# Patient Record
Sex: Female | Born: 1970 | Race: Black or African American | Hispanic: No | Marital: Married | Smoking: Never smoker
Health system: Southern US, Community
[De-identification: ages and names within clinical notes are randomized; demographics above are authoritative.]

## PROBLEM LIST (undated history)

## (undated) DIAGNOSIS — J45909 Unspecified asthma, uncomplicated: Secondary | ICD-10-CM

## (undated) DIAGNOSIS — R51 Headache: Secondary | ICD-10-CM

## (undated) DIAGNOSIS — R519 Headache, unspecified: Secondary | ICD-10-CM

## (undated) DIAGNOSIS — E119 Type 2 diabetes mellitus without complications: Secondary | ICD-10-CM

## (undated) DIAGNOSIS — Z86718 Personal history of other venous thrombosis and embolism: Secondary | ICD-10-CM

## (undated) DIAGNOSIS — K219 Gastro-esophageal reflux disease without esophagitis: Secondary | ICD-10-CM

## (undated) DIAGNOSIS — Z95828 Presence of other vascular implants and grafts: Secondary | ICD-10-CM

## (undated) HISTORY — PX: TONSILLECTOMY: SUR1361

## (undated) HISTORY — PX: CHOLECYSTECTOMY: SHX55

## (undated) HISTORY — PX: DILATION AND CURETTAGE OF UTERUS: SHX78

## (undated) HISTORY — PX: OTHER SURGICAL HISTORY: SHX169

## (undated) NOTE — *Deleted (*Deleted)
[facility]  [address], [address] [place], [place] [address] Phone: [phone]  Fax: [phone]   Clinic Day:  11/01/2019  Referring physician: Becky Augusta, NP  Chief Complaint: Karen Charles is a 37 y.o. female with a history of recurrent DVTs who is referred in consultation with Becky Augusta, NP for assessment and management.   HPI: The patient has a long history of clots and bleeding associated with hormonal birth control. She had a cerebral bleed in 1998 while on Depo injections. She had 4 PEs and 2 DVTs in her right leg in 2010 while on a Mirena IUD. She also had a DVT in her left arm in January 2021. The patient underwent bilateral salpingectomy on 08/04/2017 and is no longer taking hormonal birth control.  Bilateral lower extremity venous duplex on 09/05/2010 revealed a nonocclusive deep vein thrombosis involving the  upper portion of the superficial femoral on the right. There was no deep vein thrombosis seen on the left.  Left lower extremity venous ultrasound on 05/02/2019 revealed no evidence of acute or chronic DVT.  The patient presented to Urgent care on 10/30/2019 for left shoulder pain x 4 days. This was a similar presentation to the DVT she had in January 2021. She denies calf pain or leg swelling. She was not on any anticoagulants, though she had taken Aspirin in the past***. CT angiogram was negative for acute PE or thoracic aortic dissection. Her D-dimer was 1,074,70. She was started on Xarelto 15 mg BID x 21 days, then 20 mg daily. She was referred to hematology.  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  Symptomatically, ***  Past Medical History:  Diagnosis Date  . Asthma   . Diabetes mellitus without complication (HCC)   . GERD (gastroesophageal reflux disease)   . Headache   . Hx of blood clots   . Presence of IVC filter    inserted 07/12/2014    Past Surgical History:  Procedure Laterality Date  .  CHOLECYSTECTOMY    . DILATION AND CURETTAGE OF UTERUS    . 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 TONSILLECTOMY      Family History  Problem Relation Age of Onset  . Lupus Mother   . Diabetes Father   . Congestive Heart Failure Father   . Hypertension Father   . Breast cancer Neg Hx     Social History:  reports that she has never smoked. She has never used smokeless tobacco. She reports that she does not drink alcohol and does not use drugs.The patient is ***alone/accompanied by *** today.  Allergies: No Known Allergies  Current Medications: Current Outpatient Medications  Medication Sig Dispense Refill  . albuterol (PROVENTIL HFA;VENTOLIN HFA) 108 (90 BASE) MCG/ACT inhaler Inhale 1-2 puffs into the lungs every 6 (six) hours as needed for wheezing or shortness of breath.    Marland Kitchen aspirin 81 MG chewable tablet Chew by mouth daily.    . cetirizine (ZYRTEC) 10 MG tablet Take 10 mg by mouth daily as needed for allergies.    . cyanocobalamin 1000 MCG tablet Take by mouth.    . ergocalciferol (VITAMIN D2) 1.25 MG (50000 UT) capsule Take by mouth.    . Multiple Vitamin (MULTIVITAMIN) tablet Take  1 tablet by mouth daily.    . pantoprazole (PROTONIX) 20 MG tablet Take 20 mg by mouth daily.    Marland Kitchen RIVAROXABAN (XARELTO) VTE STARTER PACK (15 & 20 MG TABLETS) Take 15 mg by mouth 2 (two) times daily. Follow package directions: Take one 15mg  tablet by mouth twice a day. On day 22, switch to one 20mg  tablet once a day. Take with food. 51 each 0   No current facility-administered medications for this visit.    Review of Systems  Constitutional: Negative for chills, diaphoresis, fever, malaise/fatigue  and weight loss.  HENT: Negative for congestion, ear discharge, ear pain, hearing loss, nosebleeds, sinus pain, sore throat and tinnitus.   Eyes: Negative for blurred vision and double vision.  Respiratory: Negative for cough, hemoptysis, sputum production and shortness of breath.   Cardiovascular: Negative for chest pain, palpitations and leg swelling.  Gastrointestinal: Negative for abdominal pain, blood in stool, constipation, diarrhea, heartburn, melena, nausea and vomiting.  Genitourinary: Negative for dysuria, frequency, hematuria and urgency.  Musculoskeletal: Negative for back pain, joint pain, myalgias and neck pain.  Skin: Negative for itching and rash.  Neurological: Negative for dizziness, tingling, sensory change, weakness and headaches.  Endo/Heme/Allergies: Does not bruise/bleed easily.  Psychiatric/Behavioral: Negative for depression and memory loss. The patient is not nervous/anxious and does not have insomnia.   All other systems reviewed and are negative.   Performance status (ECOG): ***  Vitals Last menstrual period 10/06/2019.   Physical Exam Vitals and nursing note reviewed.  Constitutional:      General: She is not in acute distress.    Appearance: She is not diaphoretic.  HENT:     Head: Normocephalic and atraumatic.     Mouth/Throat:     Mouth: Mucous membranes are moist.     Pharynx: Oropharynx is clear.  Eyes:     General: No scleral icterus.    Extraocular Movements: Extraocular movements intact.     Conjunctiva/sclera: Conjunctivae normal.     Pupils: Pupils are equal, round, and reactive to light.  Cardiovascular:     Rate and Rhythm: Normal rate and regular rhythm.     Heart sounds: Normal heart sounds. No murmur heard.   Pulmonary:     Effort: Pulmonary effort is normal. No respiratory distress.     Breath sounds: Normal breath sounds. No wheezing or rales.  Chest:     Chest wall: No tenderness.  Abdominal:     General: Bowel sounds are  normal. There is no distension.     Palpations: Abdomen is soft. There is no mass.     Tenderness: There is no abdominal tenderness. There is no guarding or rebound.  Musculoskeletal:        General: No swelling or tenderness. Normal range of motion.     Cervical back: Normal range of motion and neck supple.  Skin:    General: Skin is warm and dry.  Neurological:     Mental Status: She is alert and oriented to person, place, and time.  Psychiatric:        Behavior: Behavior normal.        Thought Content: Thought content normal.        Judgment: Judgment normal.     No visits with results within 3 Day(s) from this visit.  Latest known visit with results is:  Admission on 10/30/2019, Discharged on 10/30/2019  Component Date Value Ref Range Status  . Sodium 10/30/2019 140  135 - 145 mmol/L Final  .  Potassium 10/30/2019 4.8  3.5 - 5.1 mmol/L Final  . Chloride 10/30/2019 107  98 - 111 mmol/L Final  . CO2 10/30/2019 23  22 - 32 mmol/L Final  . Glucose, Bld 10/30/2019 85  70 - 99 mg/dL Final   Glucose reference range applies only to samples taken after fasting for at least 8 hours.  . BUN 10/30/2019 8  6 - 20 mg/dL Final  . Creatinine, Ser 10/30/2019 1.11* 0.44 - 1.00 mg/dL Final  . Calcium 44/03/4740 9.3  8.9 - 10.3 mg/dL Final  . Total Protein 10/30/2019 8.3* 6.5 - 8.1 g/dL Final  . Albumin 59/56/3875 4.3  3.5 - 5.0 g/dL Final  . AST 64/33/2951 23  15 - 41 U/L Final  . ALT 10/30/2019 20  0 - 44 U/L Final  . Alkaline Phosphatase 10/30/2019 54  38 - 126 U/L Final  . Total Bilirubin 10/30/2019 1.6* 0.3 - 1.2 mg/dL Final  . GFR, Estimated 10/30/2019 60* >60 mL/min Final  . Anion gap 10/30/2019 10  5 - 15 Final   Performed at [facility], [address], [place], [place] [address]  . WBC 10/30/2019 5.1  4.0 - 10.5 K/uL Final  . RBC 10/30/2019 5.25* 3.87 - 5.11 MIL/uL Final  . Hemoglobin 10/30/2019 15.2* 12.0 - 15.0 g/dL Final  . HCT 60/63/0160 44.4  36 - 46 % Final  .  MCV 10/30/2019 84.6  80.0 - 100.0 fL Final  . MCH 10/30/2019 29.0  26.0 - 34.0 pg Final  . MCHC 10/30/2019 34.2  30.0 - 36.0 g/dL Final  . RDW 10/93/2355 13.2  11.5 - 15.5 % Final  . Platelets 10/30/2019 276  150 - 400 K/uL Final  . nRBC 10/30/2019 0.0  0.0 - 0.2 % Final  . Neutrophils Relative % 10/30/2019 50  % Final  . Neutro Abs 10/30/2019 2.6  1.7 - 7.7 K/uL Final  . Lymphocytes Relative 10/30/2019 38  % Final  . Lymphs Abs 10/30/2019 1.9  0.7 - 4.0 K/uL Final  . Monocytes Relative 10/30/2019 8  % Final  . Monocytes Absolute 10/30/2019 0.4  0.1 - 1.0 K/uL Final  . Eosinophils Relative 10/30/2019 3  % Final  . Eosinophils Absolute 10/30/2019 0.1  0 - 0 K/uL Final  . Basophils Relative 10/30/2019 1  % Final  . Basophils Absolute 10/30/2019 0.0  0 - 0 K/uL Final  . Immature Granulocytes 10/30/2019 0  % Final  . Abs Immature Granulocytes 10/30/2019 0.01  0.00 - 0.07 K/uL Final   Performed at [facility], [address] [place], [place] [address]  . Fibrin derivatives D-dimer ([facility]) 10/30/2019 1,074.70* 0.00 - 499.00 ng/mL (FEU) Final   Comment: (NOTE) <> Exclusion of Venous Thromboembolism (VTE) - OUTPATIENT ONLY   (Emergency Department or [place])    0-499 ng/ml (FEU): With a low to intermediate pretest probability                      for VTE this test result excludes the diagnosis                      of VTE.   >499 ng/ml (FEU) : VTE not excluded; additional work up for VTE is                      required.  <> Testing on Inpatients and Evaluation of Disseminated Intravascular   Coagulation (DIC) Reference Range:  0-499 ng/ml (FEU) Performed at [facility], [address], [place], [place] [address]     Assessment:  MARRIANNE SICA is a 3 y.o. female ***  The patient received the Pfizer COVID-19 vaccine on 03/26/20201 and 05/09/2019.  Symptomatically, ***  Plan: 1.    I discussed the assessment and treatment plan with the patient.  The  patient was provided an opportunity to ask questions and all were answered.  The patient agreed with the plan and demonstrated an understanding of the instructions.  The patient was advised to call back if the symptoms worsen or if the condition fails to improve as anticipated.  I provided *** minutes of face-to-face time during this this encounter and > 50% was spent counseling as documented under my assessment and plan.    Karen C. Merlene Pulling, MD, PhD    11/01/2019, 12:48 PM  I, Danella Penton Tufford, am acting as Neurosurgeon for [employer]. Merlene Pulling, MD, PhD.  {Add scribe attestation statement}

## (undated) NOTE — *Deleted (*Deleted)
[facility]  [address], [address] [place], [place] [address] Phone: [phone]  Fax: [phone]   Clinic Day:  12/21/2019  Referring physician: Dortha Kern, MD  Chief Complaint: Karen Charles is a 21 y.o. female s/p duodenal switch (2016) with a history of recurrent DVTs who is seen for 1 month assessment.  HPI: The patient was last seen in the hematology clinic on 11/07/2019 for an initial consult. At that time, she felt "fine". She was tolerating Xarelto. She denied any bleeding. Her menstrual cycle had increased from 3 to 7 days. She had been off birth control for 9 years. Arm and leg cramps had resolved.  [facility] labs included negative Factor V Leiden, prothrombin gene mutation.  Additional [facility] labs on 11/14/2019 included: protein S free was 74%, protein S antigen total 63%, protein C antigen 133% and antithrombin activity 139%. She continued Xarelto.  During the interim, ***   Past Medical History:  Diagnosis Date  . Asthma   . Diabetes mellitus without complication (HCC)   . GERD (gastroesophageal reflux disease)   . Headache   . Hx of blood clots   . Presence of IVC filter    inserted 07/12/2014    Past Surgical History:  Procedure Laterality Date  . CHOLECYSTECTOMY    . DILATION AND CURETTAGE OF UTERUS    . gallbladder removeral    . 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 TONSILLECTOMY      Family History  Problem Relation Age of Onset  . Lupus Mother   . Diabetes Father   . Congestive Heart Failure Father   . Hypertension Father   . Breast cancer Neg Hx     Social History:  reports that she has never smoked. She has  never used smokeless tobacco. She reports that she does not drink alcohol and does not use drugs. The patient denies alcohol, tobacco, and drug use. She works from home for [employer]. She has been working there for 21 years. She denies any exposure to radiation or toxins. She has a son and a daughter. The patient is alone*** today.  Allergies: No Known Allergies  Current Medications: Current Outpatient Medications  Medication Sig Dispense Refill  . albuterol (PROVENTIL HFA;VENTOLIN HFA) 108 (90 BASE) MCG/ACT inhaler Inhale 1-2 puffs into the lungs every 6 (six) hours as needed for wheezing or shortness of breath.    Marland Kitchen aspirin 81 MG chewable tablet Chew by mouth daily.    . cetirizine (ZYRTEC) 10 MG tablet Take 10 mg by mouth daily as needed for allergies.    . Cholecalciferol (VITAMIN D3) 1.25 MG (50000 UT) TABS Take 50,000 Units by mouth once a week.    . cyanocobalamin 1000 MCG tablet Take by mouth.    . ergocalciferol (VITAMIN D2) 1.25 MG (50000 UT) capsule Take by mouth. (Patient not taking: Reported on 11/11/2019)    . Multiple Vitamin (MULTIVITAMIN) tablet Take 1 tablet by mouth daily.    . pantoprazole (PROTONIX) 20 MG tablet Take 20 mg by mouth daily.    Marland Kitchen RIVAROXABAN (XARELTO) VTE STARTER PACK (15 & 20 MG TABLETS) Take 15 mg by mouth 2 (two) times daily. Follow package directions: Take one 15mg  tablet by mouth twice a  day. On day 22, switch to one 20mg  tablet once a day. Take with food. 51 each 0   No current facility-administered medications for this visit.    Review of Systems  Constitutional: Negative for chills, diaphoresis, fever, malaise/fatigue and weight loss (stable).       Feels "fine".  HENT: Negative for congestion, ear discharge, ear pain, hearing loss, nosebleeds, sinus pain, sore throat and tinnitus.   Eyes: Negative for blurred vision.  Respiratory: Negative for cough, hemoptysis, sputum production and shortness of breath.   Cardiovascular: Negative for chest pain,  palpitations and leg swelling.  Gastrointestinal: Negative for abdominal pain, blood in stool, constipation, diarrhea, heartburn, melena, nausea and vomiting.  Genitourinary: Negative for dysuria, frequency, hematuria and urgency.  Musculoskeletal: Negative for back pain, joint pain (hips), myalgias (arm and leg aches, resolved) and neck pain.  Skin: Negative for itching and rash.  Neurological: Positive for tingling (left arm, when she lays down). Negative for dizziness, sensory change, weakness and headaches.  Endo/Heme/Allergies: Bruises/bleeds easily (bruising on Xarelto).  Psychiatric/Behavioral: Negative for depression and memory loss. The patient has insomnia (due to muscle aches). The patient is not nervous/anxious.   All other systems reviewed and are negative.  Performance status (ECOG): 1***  Vitals There were no vitals taken for this visit.   Physical Exam Vitals and nursing note reviewed.  Constitutional:      General: She is not in acute distress.    Appearance: She is not diaphoretic.  HENT:     Head: Normocephalic and atraumatic.     Comments: Styled dark hair. Eyes:     General: No scleral icterus.    Extraocular Movements: Extraocular movements intact.     Conjunctiva/sclera: Conjunctivae normal.     Comments: Brown eyes.  Musculoskeletal:        General: No swelling. Normal range of motion.  Neurological:     Mental Status: She is alert and oriented to person, place, and time.  Psychiatric:        Behavior: Behavior normal.        Thought Content: Thought content normal.        Judgment: Judgment normal.    No visits with results within 3 Day(s) from this visit.  Latest known visit with results is:  Admission on 10/30/2019, Discharged on 10/30/2019  Component Date Value Ref Range Status  . Sodium 10/30/2019 140  135 - 145 mmol/L Final  . Potassium 10/30/2019 4.8  3.5 - 5.1 mmol/L Final  . Chloride 10/30/2019 107  98 - 111 mmol/L Final  . CO2 10/30/2019  23  22 - 32 mmol/L Final  . Glucose, Bld 10/30/2019 85  70 - 99 mg/dL Final   Glucose reference range applies only to samples taken after fasting for at least 8 hours.  . BUN 10/30/2019 8  6 - 20 mg/dL Final  . Creatinine, Ser 10/30/2019 1.11* 0.44 - 1.00 mg/dL Final  . Calcium 16/10/9602 9.3  8.9 - 10.3 mg/dL Final  . Total Protein 10/30/2019 8.3* 6.5 - 8.1 g/dL Final  . Albumin 54/09/8117 4.3  3.5 - 5.0 g/dL Final  . AST 14/78/2956 23  15 - 41 U/L Final  . ALT 10/30/2019 20  0 - 44 U/L Final  . Alkaline Phosphatase 10/30/2019 54  38 - 126 U/L Final  . Total Bilirubin 10/30/2019 1.6* 0.3 - 1.2 mg/dL Final  . GFR, Estimated 10/30/2019 60* >60 mL/min Final  . Anion gap 10/30/2019 10  5 - 15 Final  Performed at [facility], [address] [place], [place] [address]  . WBC 10/30/2019 5.1  4.0 - 10.5 K/uL Final  . RBC 10/30/2019 5.25* 3.87 - 5.11 MIL/uL Final  . Hemoglobin 10/30/2019 15.2* 12.0 - 15.0 g/dL Final  . HCT 60/45/4098 44.4  36 - 46 % Final  . MCV 10/30/2019 84.6  80.0 - 100.0 fL Final  . MCH 10/30/2019 29.0  26.0 - 34.0 pg Final  . MCHC 10/30/2019 34.2  30.0 - 36.0 g/dL Final  . RDW 11/91/4782 13.2  11.5 - 15.5 % Final  . Platelets 10/30/2019 276  150 - 400 K/uL Final  . nRBC 10/30/2019 0.0  0.0 - 0.2 % Final  . Neutrophils Relative % 10/30/2019 50  % Final  . Neutro Abs 10/30/2019 2.6  1.7 - 7.7 K/uL Final  . Lymphocytes Relative 10/30/2019 38  % Final  . Lymphs Abs 10/30/2019 1.9  0.7 - 4.0 K/uL Final  . Monocytes Relative 10/30/2019 8  % Final  . Monocytes Absolute 10/30/2019 0.4  0.1 - 1.0 K/uL Final  . Eosinophils Relative 10/30/2019 3  % Final  . Eosinophils Absolute 10/30/2019 0.1  0.0 - 0.5 K/uL Final  . Basophils Relative 10/30/2019 1  % Final  . Basophils Absolute 10/30/2019 0.0  0.0 - 0.1 K/uL Final  . Immature Granulocytes 10/30/2019 0  % Final  . Abs Immature Granulocytes 10/30/2019 0.01  0.00 - 0.07 K/uL Final   Performed at [facility], [address] [place], [place] [address]  . Fibrin derivatives D-dimer ([facility]) 10/30/2019 1,074.70* 0.00 - 499.00 ng/mL (FEU) Final   Comment: (NOTE) <> Exclusion of Venous Thromboembolism (VTE) - OUTPATIENT ONLY   (Emergency Department or [place])    0-499 ng/ml (FEU): With a low to intermediate pretest probability                      for VTE this test result excludes the diagnosis                      of VTE.   >499 ng/ml (FEU) : VTE not excluded; additional work up for VTE is                      required.  <> Testing on Inpatients and Evaluation of Disseminated Intravascular   Coagulation (DIC) Reference Range:   0-499 ng/ml (FEU) Performed at [facility], [address] [place], [place] [address]     Assessment:  Karen Charles is a 34 y.o. female with a history of pulmonary embolism and right lower extremity DVT.  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  Chest CT on 09/04/2010 revealed bilateral pulmonary emboli.  There was thrombus in the left main pulmonary artery, and segmental as well as subsegmental branches of the right lower lobe, left lower lobe, right upper lobe and left upper lobe pulmonary arteries c/w pulmonary emboli. The main pulmonary artery, right main pulmonary artery, and left main pulmonary arteries were normal in size.  Bilateral lower extremity venous duplex on 09/05/2010 revealed a nonocclusive deep vein thrombosis involving the upper portion of the superficial femoral on the right. There was no deep vein thrombosis seen on the left. She had a Mirena IUD at the time. She believes she was treated with Coumadin < than a year.   [facility] labs on 11/07/2019 revealed a hematocrit of 43.8, hemoglobin  14.4, MCV 87, platelets 240,000, WBC 6,000. CMP revealed a creatinine of 1.15 and normal LFTs. PT was 11.9 with an INR 1.1. D-dimers were 0.37 mg/L (normal).  Factor V Leiden and prothrombin gene mutation were negative.   [facility] labs on 11/14/2019 included the following normal labs: protein S free (74%), protein S antigen total (63%), protein C antigen (133%) and antithrombin activity (139%).   She has had unusual left arm and shoulder pain.  Vascular surgery evaluation revealed no thrombosis.  She denies any family history of blood disorders.  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  She takes Vitamin D3, vitamin B12 5,000 mcg, and oral iron once daily.   The patient received the Pfizer COVID-19 vaccine on 03/26/20201 and 05/09/2019.  Symptomatically, ***  Plan: 1.   Review [facility] labs   2.   History of PE and DVT             Patient is s/p DVT and PET in 08/2010.                         Clot associated with Mirena IUD.             She has had unusual arm pain on 2 separate occasions (01/2019 and 10/30/2019).                         With the first episode, symptoms resolved with aspirin.                         With the second episode, she was started on Xarelto.                                     Chect CT angiogram was negative.             Left lower extremity venous ultrasound on 05/02/2019 revealed no evidence of acute or chronic DVT.             Review vascular surgery consult.   No evidence of thrombosis.   D-dimers are negative.             Follow-up lupus anticoagulant panel, anticardiolipin antibodies, beta2-glycoprotein antibodies.             Continue Xarelto for now. 3.   Elevated d-dimers, resolved             D-dimer was 1074.70 (0-499) on 10/30/2019.  D-dimer was 0.37 (0-0.49) on 11/07/2019.             Chest CT angiogram on 10/30/2019 revealed no evidence of pulmonary embolism.             Vascular surgery evaluation revealed no evidence of upper extremity thrombosis. 4.   History of bariatric surgery             Hematocrit 44.4.  Hemoglobin 15.2.  MCV 84.6 on 10/30/2019.             Patient at risk for iron deficiency anemia and B12 deficiency.             Monitor ferritin,  iron studies, B12. 5.   RN: Call patient with lab results pending. 6.   If lupus anticoagulant +, then will repeat off Xarelto (RN to call if needed and review directions). 7.   RTC in 1  month for MD assessment and summary of results.  I discussed the assessment and treatment plan with the patient.  The patient was provided an opportunity to ask questions and all were answered.  The patient agreed with the plan and demonstrated an understanding of the instructions.  The patient was advised to call back if the symptoms worsen or if the condition fails to improve as anticipated.  I provided *** minutes of face-to-face time during this this encounter and > 50% was spent counseling as documented under my assessment and plan.  Melissa C. Merlene Pulling, MD, PhD    12/21/2019, 3:37 PM  I, Danella Penton Tufford, am acting as Neurosurgeon for [employer]. Merlene Pulling, MD, PhD.  I, Melissa C. Merlene Pulling, MD, have reviewed the above documentation for accuracy and completeness, and I agree with the above.

---

## 2004-10-10 ENCOUNTER — Emergency Department: Payer: Self-pay | Admitting: Emergency Medicine

## 2004-10-10 ENCOUNTER — Other Ambulatory Visit: Payer: Self-pay

## 2005-07-02 ENCOUNTER — Emergency Department: Payer: Self-pay | Admitting: Emergency Medicine

## 2009-05-22 ENCOUNTER — Ambulatory Visit: Payer: Self-pay | Admitting: Family Medicine

## 2009-06-11 ENCOUNTER — Ambulatory Visit: Payer: Self-pay | Admitting: General Surgery

## 2009-06-14 ENCOUNTER — Ambulatory Visit: Payer: Self-pay | Admitting: General Surgery

## 2009-06-19 ENCOUNTER — Ambulatory Visit: Payer: Self-pay | Admitting: General Surgery

## 2009-11-06 ENCOUNTER — Ambulatory Visit: Payer: Self-pay | Admitting: Emergency Medicine

## 2010-08-13 ENCOUNTER — Ambulatory Visit: Payer: Self-pay | Admitting: Unknown Physician Specialty

## 2010-08-14 LAB — PATHOLOGY REPORT

## 2010-09-04 ENCOUNTER — Ambulatory Visit: Payer: Self-pay | Admitting: Family Medicine

## 2010-09-04 ENCOUNTER — Inpatient Hospital Stay: Payer: Self-pay | Admitting: Internal Medicine

## 2011-04-02 ENCOUNTER — Emergency Department: Payer: Self-pay

## 2011-04-02 LAB — COMPREHENSIVE METABOLIC PANEL
Albumin: 3.6 g/dL (ref 3.4–5.0)
Alkaline Phosphatase: 62 U/L (ref 50–136)
Anion Gap: 11 (ref 7–16)
BUN: 7 mg/dL (ref 7–18)
Bilirubin,Total: 0.6 mg/dL (ref 0.2–1.0)
Calcium, Total: 8.8 mg/dL (ref 8.5–10.1)
Creatinine: 1.17 mg/dL (ref 0.60–1.30)
EGFR (African American): >60
Glucose: 106 mg/dL — ABNORMAL HIGH (ref 65–99)
Osmolality: 283 (ref 275–301)
Potassium: 3.8 mmol/L (ref 3.5–5.1)
Sodium: 143 mmol/L (ref 136–145)
Total Protein: 7.7 g/dL (ref 6.4–8.2)

## 2011-04-02 LAB — CBC
MCH: 27.6 pg (ref 26.0–34.0)
MCHC: 33.1 g/dL (ref 32.0–36.0)
Platelet: 280 10*3/uL (ref 150–440)
RBC: 4.69 10*6/uL (ref 3.80–5.20)

## 2011-05-26 ENCOUNTER — Ambulatory Visit: Payer: Self-pay | Admitting: Family Medicine

## 2012-09-11 IMAGING — CR DG CHEST 1V
1 series · 1 of 1 positions shown · non-contrast
Comparison: none

REASON FOR EXAM: tachycardia;sob
COMMENTS:   LMP: > one month ago

PROCEDURE:     MDR - MDR CHEST 1 VIEW AP OR PA  - September 04, 2010  [DATE]
RESULT:     The lung fields are clear. The heart, mediastinal and osseous
structures show no significant abnormalities.

[view not recorded]
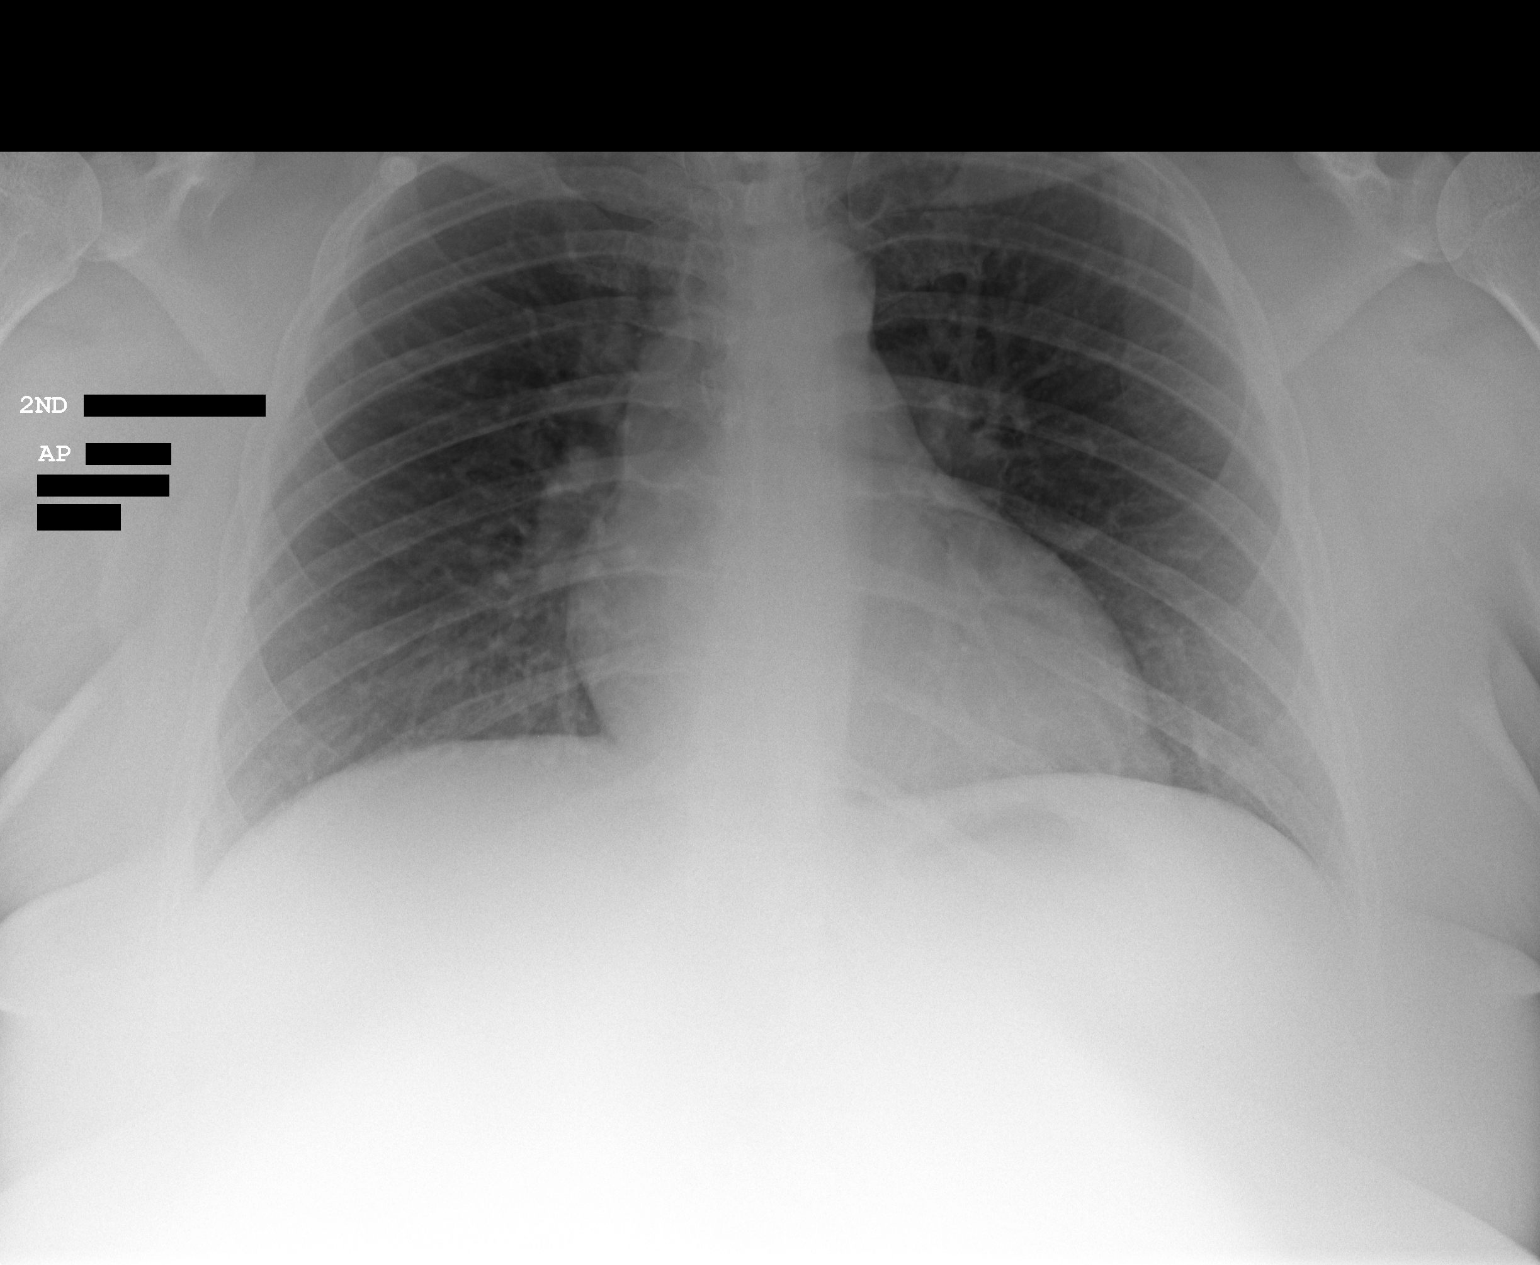

[1 of 1 positions shown; findings below may reference images not displayed]

IMPRESSION: No significant abnormalities are noted.

## 2012-09-11 IMAGING — CT CT CHEST W/ CM
1 series · 15 of 33 positions shown, 19 images · IV contrast (APPLIED)
Comparison: None

REASON FOR EXAM: + d dimer
COMMENTS:

PROCEDURE:     CT  - CT CHEST (FOR PE) W  - September 04, 2010  [DATE]
RESULT:     Indications: Shortness of breath
TECHNIQUE: A thin-section spiral CT from the lung apices to the upper
abdomen was acquired on a multi slice scanner following 100ml [id number]
intravenous contrast. These images were then transferred to the Siemens work
station and were subsequently reviewed utilizing 3-D reconstructions and MIP
images.

[Series 4: soft tissue · axial · 0.75mm/px · z∈[-126,+108]mm · 15 of 92 slices shown, 19 images]
[im 7/92  mediastinal]
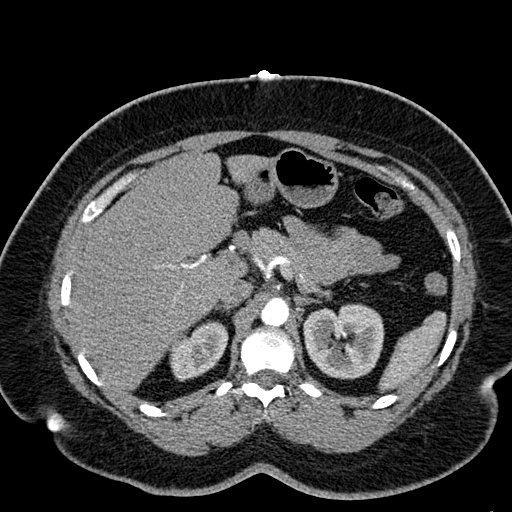
[im 7/92  lung]
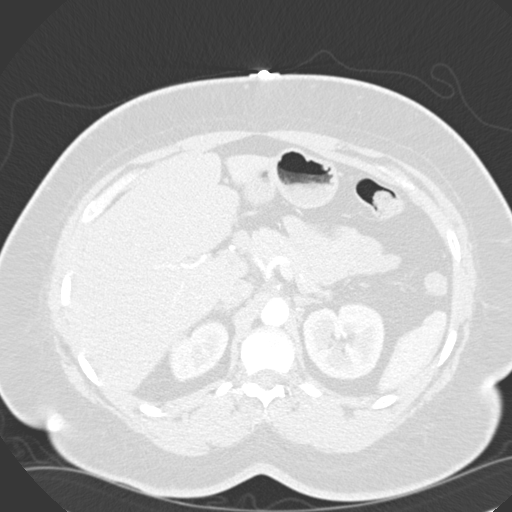
[im 14/92  lung]
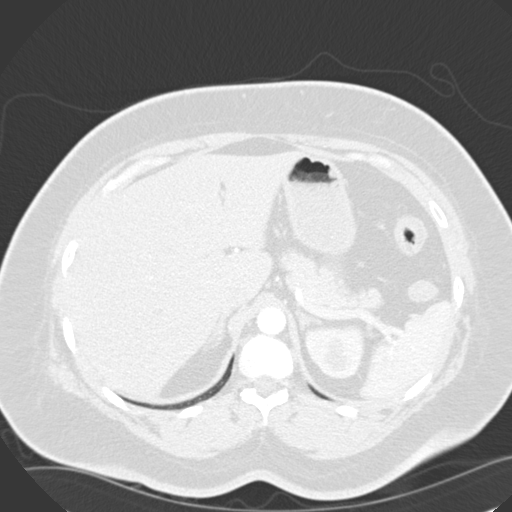
[im 19/92  lung]
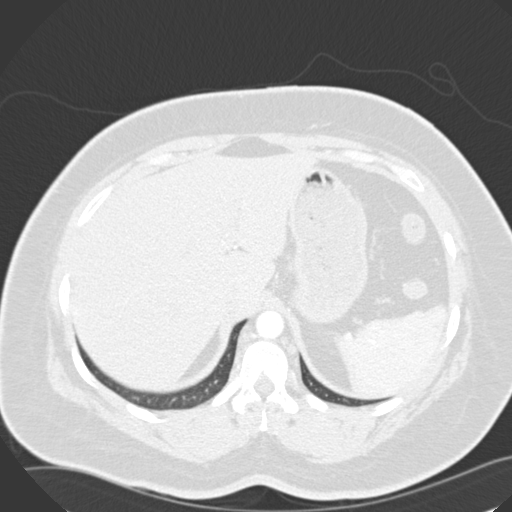
[im 24/92  lung]
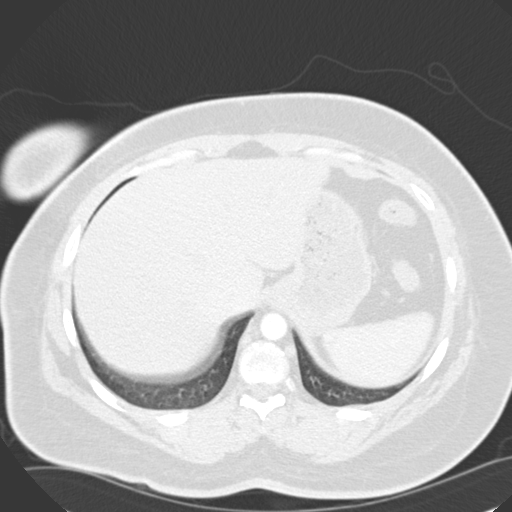
[im 31/92  mediastinal]
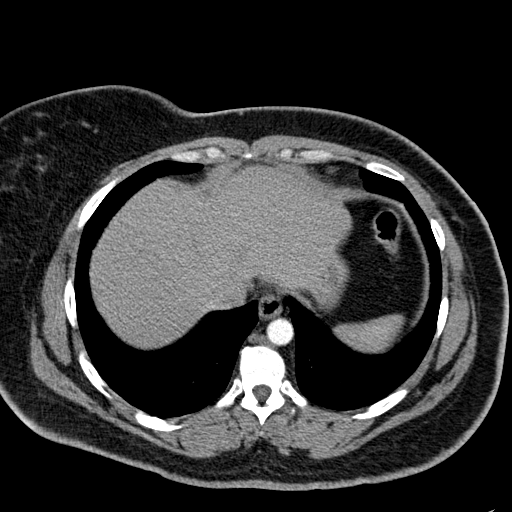
[im 31/92  lung]
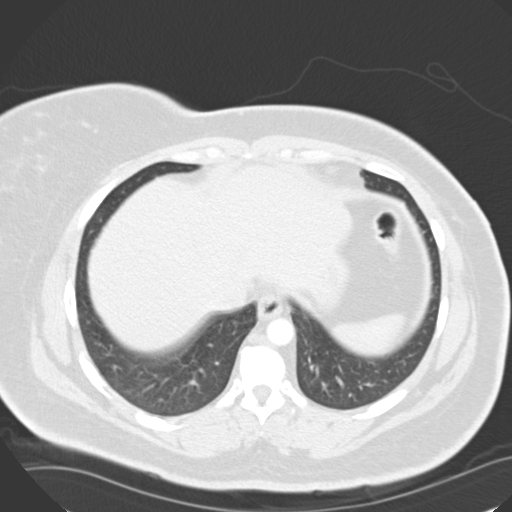
[im 37/92  lung]
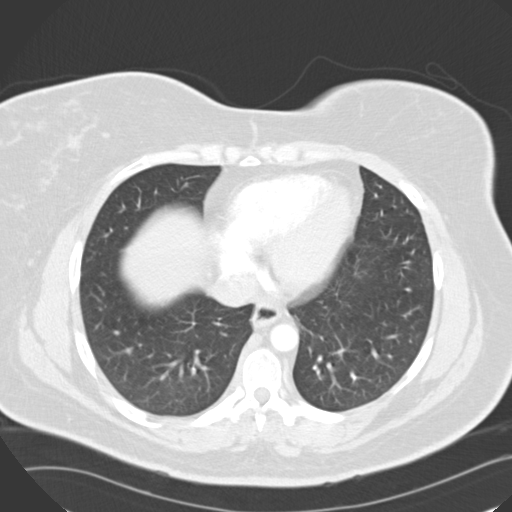
[im 41/92  lung]
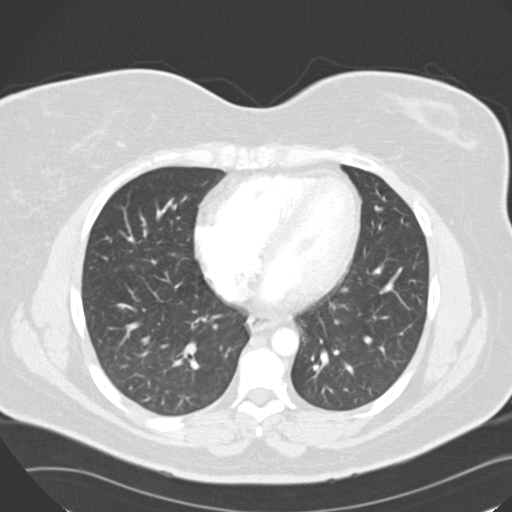
[im 48/92  lung]
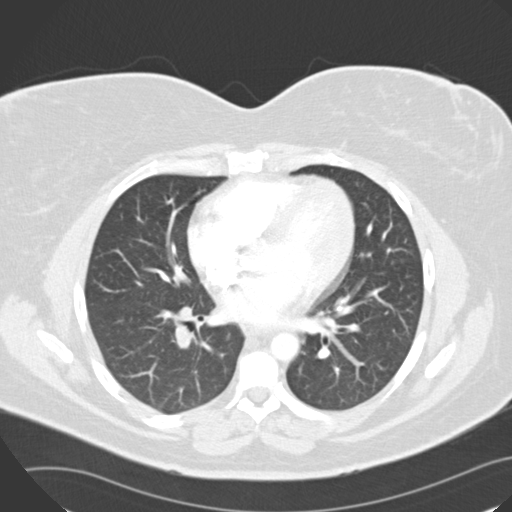
[im 51/92  mediastinal]
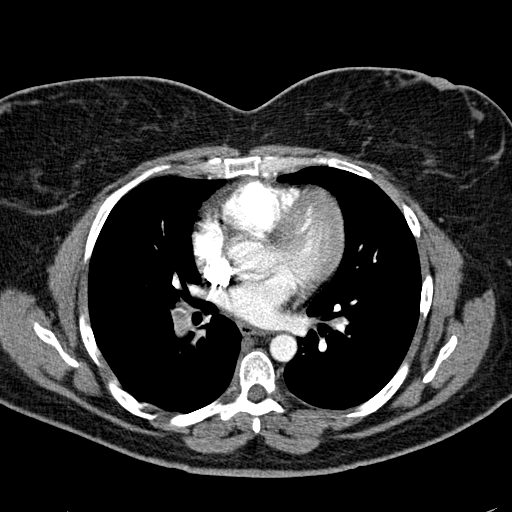
[im 51/92  lung]
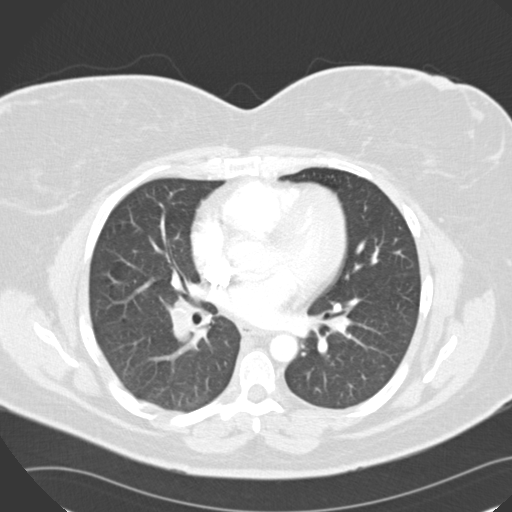
[im 55/92  lung]
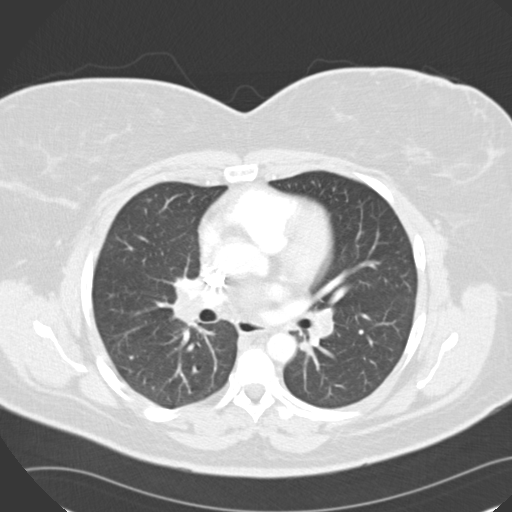
[im 61/92  lung]
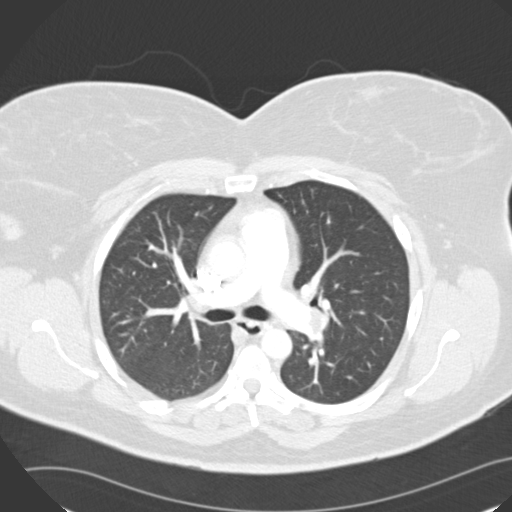
[im 68/92  lung]
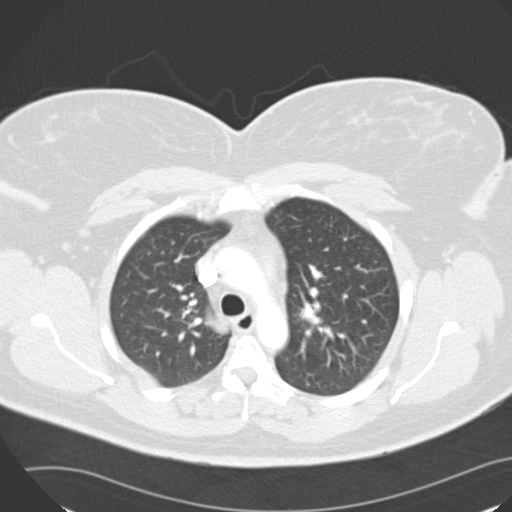
[im 73/92  mediastinal]
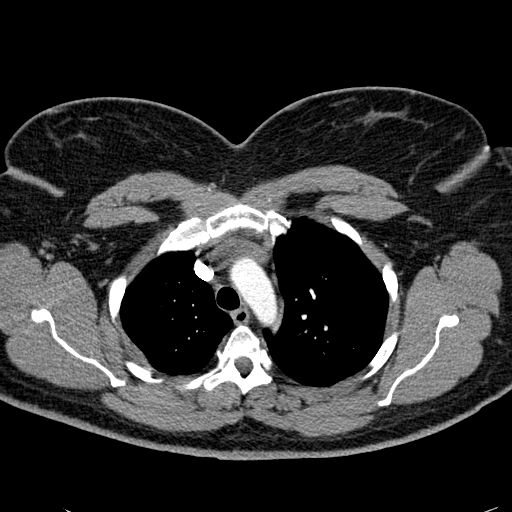
[im 73/92  lung]
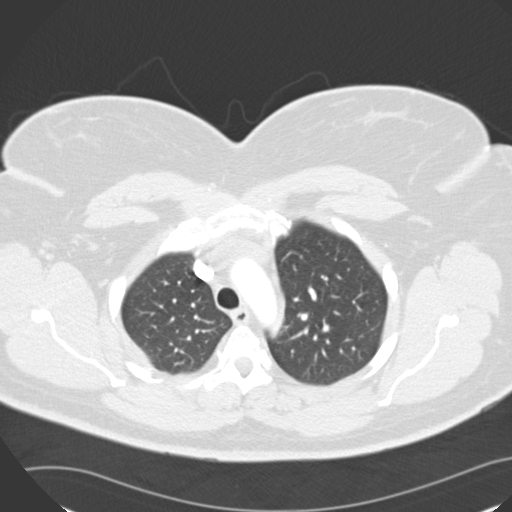
[im 78/92  lung]
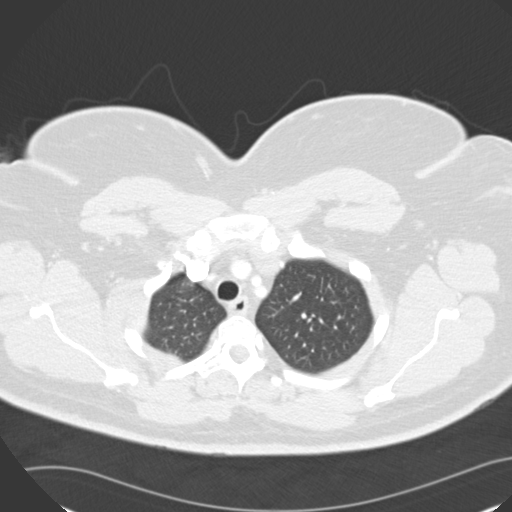
[im 85/92  lung]
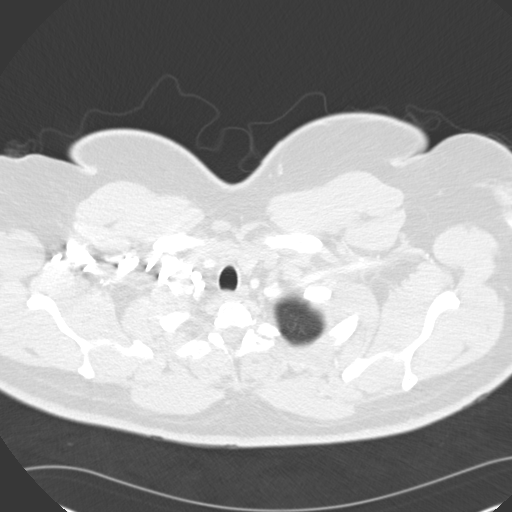

[15 of 33 positions shown; findings below may reference images not displayed]

FINDINGS: There is adequate opacification of the pulmonary arteries. There is thrombus
present within the left main pulmonary artery, and segmental as well as
subsegmental branches of the right lower lobe, left lower lobe, right upper
lobe and left upper lobe pulmonary arteries consistent with pulmonary
emboli. The main pulmonary artery, right main pulmonary artery, and left
main pulmonary arteries are normal in size. The heart size is normal. There
is no pericardial effusion.

The lungs are clear. There is no focal consolidation, pleural effusion, or
pneumothorax.

There is no axillary, hilar, or mediastinal adenopathy.

The osseous structures are unremarkable.

The visualized portions of the upper abdomen are unremarkable.
IMPRESSION: 1. Bilateral pulmonary emboli as described above. These findings were
communicated to Dr. Louiz on 09/04/2010 at 7012 hours.

## 2013-02-03 ENCOUNTER — Ambulatory Visit: Payer: Self-pay | Admitting: Family Medicine

## 2013-04-09 IMAGING — CR DG CHEST 2V
1 series · 2 of 2 positions shown · non-contrast
Comparison: none

REASON FOR EXAM: cp
COMMENTS:

PROCEDURE:     DXR - DXR CHEST PA (OR AP) AND LATERAL  - April 02, 2011  [DATE]
RESULT:     Comparison is made to the prior exam of 09/04/2010. The lung
fields are clear. The heart, mediastinal and osseous structures reveal no
significant abnormalities.

[Series 1: pa · 0.17mm/px · 2 of 2 slices shown]
[im 1/2]
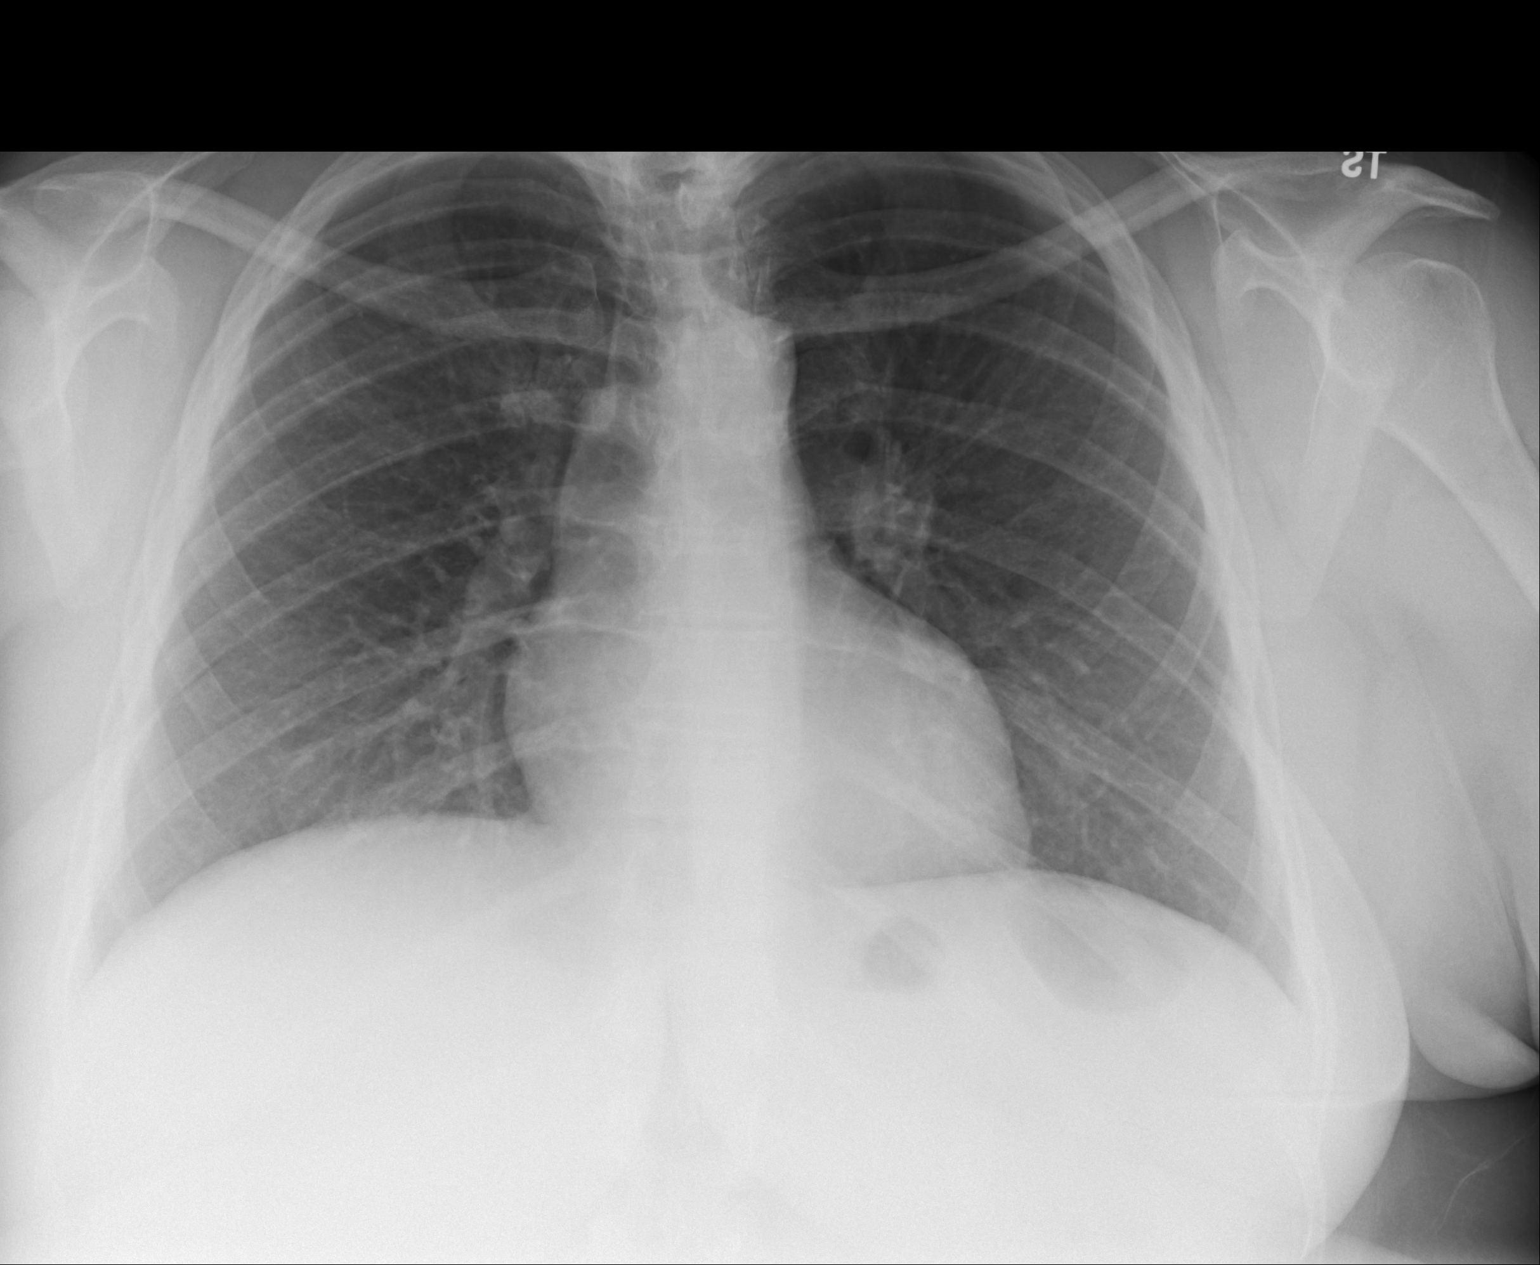
[im 2/2]
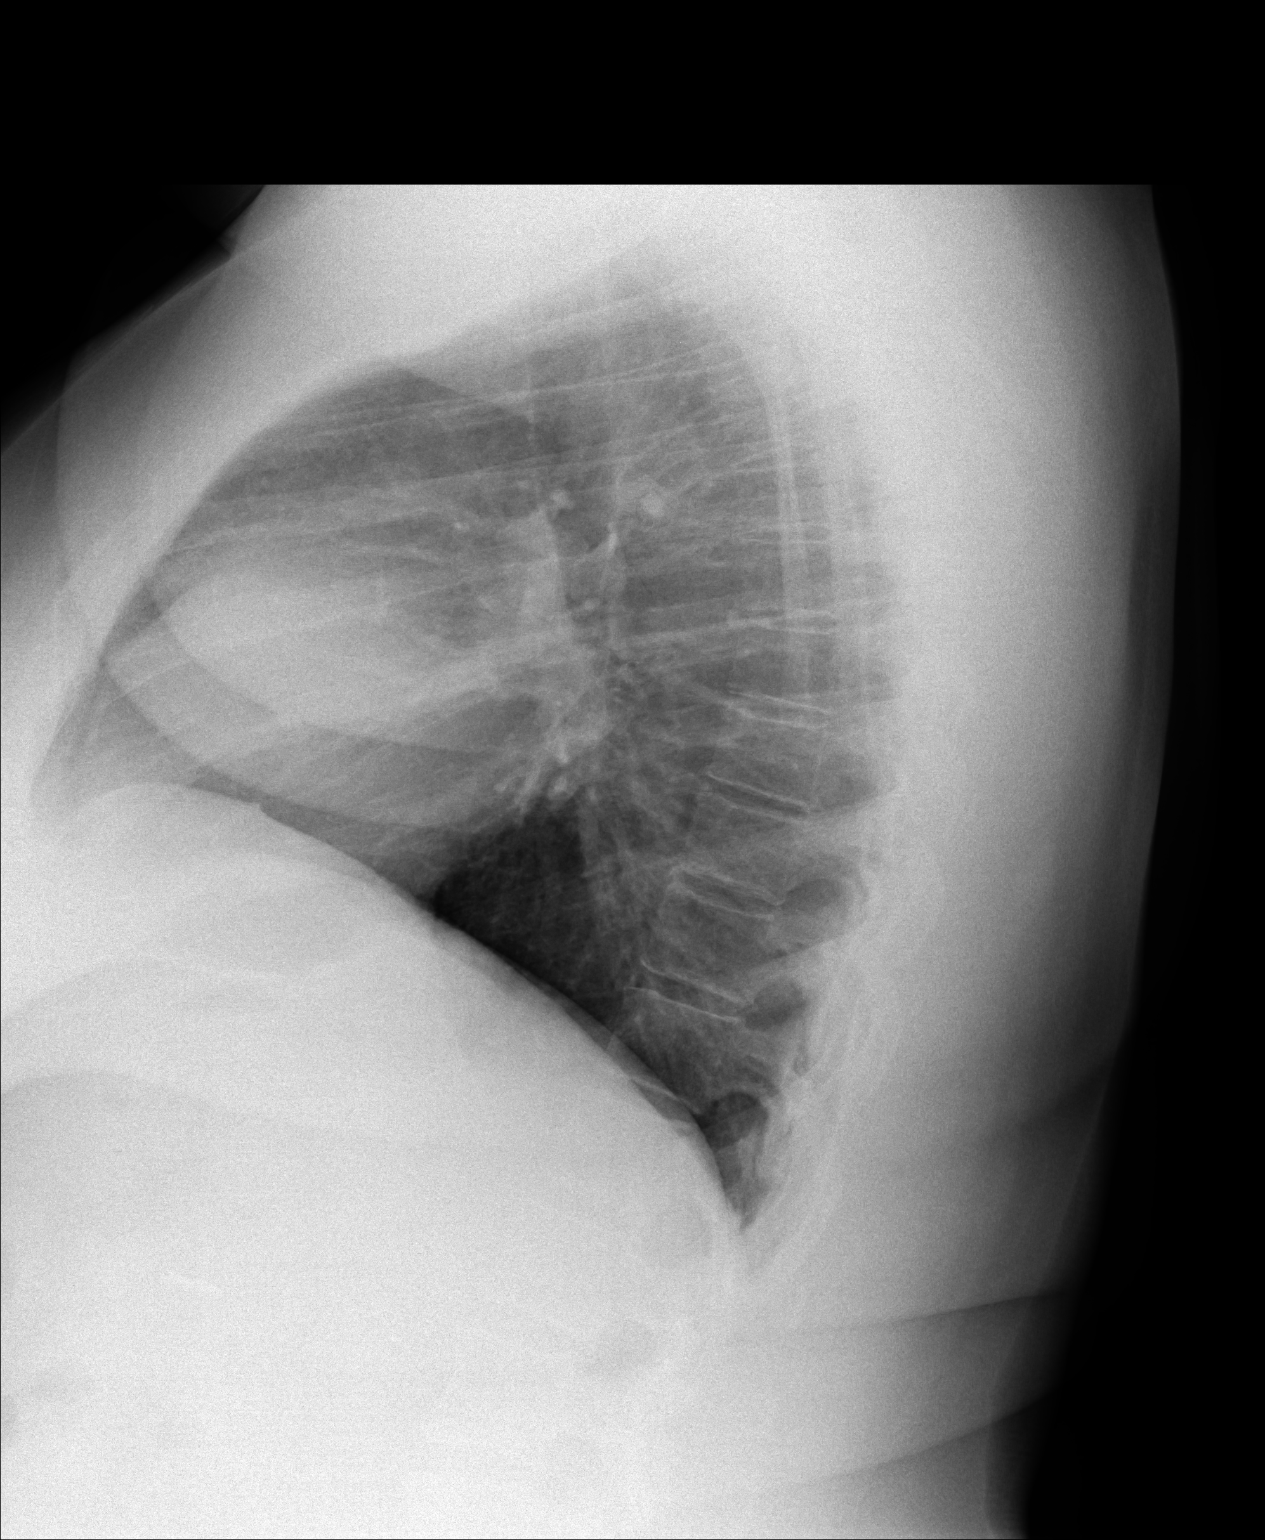

[2 of 2 positions shown; findings below may reference images not displayed]

IMPRESSION: No significant abnormalities are noted.

## 2013-12-13 ENCOUNTER — Ambulatory Visit: Payer: Self-pay | Admitting: Bariatrics

## 2013-12-27 ENCOUNTER — Ambulatory Visit: Payer: Self-pay | Admitting: Gastroenterology

## 2014-07-05 ENCOUNTER — Other Ambulatory Visit: Payer: Self-pay

## 2014-07-13 ENCOUNTER — Encounter
Admission: RE | Admit: 2014-07-13 | Discharge: 2014-07-13 | Disposition: A | Discharge status: Home / Self Care | Payer: 59 | Source: Ambulatory Visit | Attending: Bariatrics | Admitting: Bariatrics

## 2014-07-13 DIAGNOSIS — E119 Type 2 diabetes mellitus without complications: Secondary | ICD-10-CM | POA: Diagnosis not present

## 2014-07-13 DIAGNOSIS — E559 Vitamin D deficiency, unspecified: Secondary | ICD-10-CM | POA: Insufficient documentation

## 2014-07-13 DIAGNOSIS — R12 Heartburn: Secondary | ICD-10-CM | POA: Insufficient documentation

## 2014-07-13 DIAGNOSIS — Z86718 Personal history of other venous thrombosis and embolism: Secondary | ICD-10-CM | POA: Insufficient documentation

## 2014-07-13 DIAGNOSIS — Z01812 Encounter for preprocedural laboratory examination: Secondary | ICD-10-CM | POA: Diagnosis not present

## 2014-07-13 DIAGNOSIS — K449 Diaphragmatic hernia without obstruction or gangrene: Secondary | ICD-10-CM | POA: Insufficient documentation

## 2014-07-13 HISTORY — DX: Type 2 diabetes mellitus without complications: E11.9

## 2014-07-13 HISTORY — DX: Presence of other vascular implants and grafts: Z95.828

## 2014-07-13 HISTORY — DX: Unspecified asthma, uncomplicated: J45.909

## 2014-07-13 HISTORY — DX: Gastro-esophageal reflux disease without esophagitis: K21.9

## 2014-07-13 HISTORY — DX: Headache, unspecified: R51.9

## 2014-07-13 HISTORY — DX: Personal history of other venous thrombosis and embolism: Z86.718

## 2014-07-13 HISTORY — DX: Headache: R51

## 2014-07-13 LAB — DIFFERENTIAL
BASOS ABS: 0 10*3/uL (ref 0–0.1)
BASOS PCT: 0 %
EOS PCT: 6 %
Eosinophils Absolute: 0.4 10*3/uL (ref 0–0.7)
Lymphocytes Relative: 29 %
Lymphs Abs: 1.9 10*3/uL (ref 1.0–3.6)
Monocytes Absolute: 0.4 10*3/uL (ref 0.2–0.9)
Monocytes Relative: 7 %
NEUTROS PCT: 58 %
Neutro Abs: 3.9 10*3/uL (ref 1.4–6.5)

## 2014-07-13 LAB — ABO/RH: ABO/RH(D): AB POS

## 2014-07-13 LAB — BASIC METABOLIC PANEL
Anion gap: 7 (ref 5–15)
BUN: 21 mg/dL — AB (ref 6–20)
CO2: 22 mmol/L (ref 22–32)
CREATININE: 1.13 mg/dL — AB (ref 0.44–1.00)
Calcium: 9.6 mg/dL (ref 8.9–10.3)
Chloride: 109 mmol/L (ref 101–111)
GFR calc Af Amer: >60 mL/min (ref >60)
GFR calc non Af Amer: 60 mL/min — ABNORMAL LOW (ref >60)
Glucose, Bld: 101 mg/dL — ABNORMAL HIGH (ref 65–99)
POTASSIUM: 4.3 mmol/L (ref 3.5–5.1)
SODIUM: 138 mmol/L (ref 135–145)

## 2014-07-13 LAB — TYPE AND SCREEN
ABO/RH(D): AB POS
ANTIBODY SCREEN: NEGATIVE

## 2014-07-13 LAB — CBC
HCT: 43.5 % (ref 35.0–47.0)
Hemoglobin: 14.5 g/dL (ref 12.0–16.0)
MCH: 28.2 pg (ref 26.0–34.0)
MCHC: 33.3 g/dL (ref 32.0–36.0)
MCV: 84.8 fL (ref 80.0–100.0)
Platelets: 235 10*3/uL (ref 150–440)
RBC: 5.13 MIL/uL (ref 3.80–5.20)
RDW: 13.9 % (ref 11.5–14.5)
WBC: 6.7 10*3/uL (ref 3.6–11.0)

## 2014-07-13 LAB — PROTIME-INR
INR: 1.05
Prothrombin Time: 13.9 seconds (ref 11.4–15.0)

## 2014-07-13 NOTE — Patient Instructions (Signed)
  Your procedure is scheduled on July 18, 2014 (Tuesday) Report to Day Surgery. To find out your arrival time please call [phone] between 1PM - 3PM on July 17, 2014 (Monday).  Remember: Instructions that are not followed completely may result in serious medical risk, up to and including death, or upon the discretion of your surgeon and anesthesiologist your surgery may need to be rescheduled.    __x__ 1. Do not eat food or drink liquids after midnight. No gum chewing or hard candies.     ____ 2. No Alcohol for 24 hours before or after surgery.   ____ 3. Bring all medications with you on the day of surgery if instructed.    __x__ 4. Notify your doctor if there is any change in your medical condition     (cold, fever, infections).     Do not wear jewelry, make-up, hairpins, clips or nail polish.  Do not wear lotions, powders, or perfumes. You may wear deodorant.  Do not shave 48 hours prior to surgery. Men may shave face and neck.  Do not bring valuables to the hospital.    [facility] is not responsible for any belongings or valuables.               Contacts, dentures or bridgework may not be worn into surgery.  Leave your suitcase in the car. After surgery it may be brought to your room.  For patients admitted to the hospital, discharge time is determined by your                treatment team.   Patients discharged the day of surgery will not be allowed to drive home.   Please read over the following fact sheets that you were given:   Surgical Site Infection Prevention   ____ Take these medicines the morning of surgery with A SIP OF WATER:    1. Albuterol inhaler (bring to hospital)  2.   3.   4.  5.  6.  ____ Fleet Enema (as directed)   __x__ Use CHG Soap as directed  __x__ Use inhalers on the day of surgery  ____ Stop metformin 2 days prior to surgery    ____ Take 1/2 of usual insulin dose the night before surgery and none on the morning of surgery.   ____  Stop Coumadin/Plavix/aspirin on   ____ Stop Anti-inflammatories on    ____ Stop supplements until after surgery.    ____ Bring C-Pap to the hospital.

## 2014-07-18 ENCOUNTER — Encounter: Payer: Self-pay | Admitting: *Deleted

## 2014-07-18 ENCOUNTER — Inpatient Hospital Stay: Payer: 59 | Admitting: Anesthesiology

## 2014-07-18 ENCOUNTER — Encounter
Admission: RE | Disposition: A | Discharge status: Home / Self Care | Payer: Self-pay | Source: Ambulatory Visit | Attending: Bariatrics

## 2014-07-18 ENCOUNTER — Inpatient Hospital Stay
Admission: RE | Admit: 2014-07-18 | Discharge: 2014-07-20 | DRG: 621 | Disposition: A | Discharge status: Home / Self Care | Payer: 59 | Source: Ambulatory Visit | Attending: Bariatrics | Admitting: Bariatrics

## 2014-07-18 DIAGNOSIS — Z6841 Body Mass Index (BMI) 40.0 and over, adult: Secondary | ICD-10-CM

## 2014-07-18 DIAGNOSIS — K449 Diaphragmatic hernia without obstruction or gangrene: Secondary | ICD-10-CM | POA: Diagnosis present

## 2014-07-18 DIAGNOSIS — E559 Vitamin D deficiency, unspecified: Secondary | ICD-10-CM | POA: Diagnosis present

## 2014-07-18 DIAGNOSIS — G4733 Obstructive sleep apnea (adult) (pediatric): Secondary | ICD-10-CM | POA: Diagnosis present

## 2014-07-18 DIAGNOSIS — Z9889 Other specified postprocedural states: Secondary | ICD-10-CM

## 2014-07-18 DIAGNOSIS — K219 Gastro-esophageal reflux disease without esophagitis: Secondary | ICD-10-CM | POA: Diagnosis present

## 2014-07-18 DIAGNOSIS — Z79899 Other long term (current) drug therapy: Secondary | ICD-10-CM

## 2014-07-18 DIAGNOSIS — Z9884 Bariatric surgery status: Secondary | ICD-10-CM

## 2014-07-18 DIAGNOSIS — Z9049 Acquired absence of other specified parts of digestive tract: Secondary | ICD-10-CM | POA: Diagnosis present

## 2014-07-18 DIAGNOSIS — Z79891 Long term (current) use of opiate analgesic: Secondary | ICD-10-CM | POA: Diagnosis not present

## 2014-07-18 DIAGNOSIS — E119 Type 2 diabetes mellitus without complications: Secondary | ICD-10-CM | POA: Diagnosis present

## 2014-07-18 HISTORY — PX: LAPAROSCOPIC GASTRIC RESTRICTIVE DUODENAL PROCEDURE (DUODENAL SWITCH): SHX6667

## 2014-07-18 LAB — CBC
HCT: 40.3 % (ref 35.0–47.0)
Hemoglobin: 13.3 g/dL (ref 12.0–16.0)
MCH: 28 pg (ref 26.0–34.0)
MCHC: 32.9 g/dL (ref 32.0–36.0)
MCV: 85.1 fL (ref 80.0–100.0)
PLATELETS: 185 10*3/uL (ref 150–440)
RBC: 4.74 MIL/uL (ref 3.80–5.20)
RDW: 13.8 % (ref 11.5–14.5)
WBC: 12.4 10*3/uL — AB (ref 3.6–11.0)

## 2014-07-18 LAB — CREATININE, SERUM
Creatinine, Ser: 1.22 mg/dL — ABNORMAL HIGH (ref 0.44–1.00)
GFR calc Af Amer: >60 mL/min (ref >60)
GFR calc non Af Amer: 54 mL/min — ABNORMAL LOW (ref >60)

## 2014-07-18 LAB — GLUCOSE, CAPILLARY
GLUCOSE-CAPILLARY: 177 mg/dL — AB (ref 65–99)
Glucose-Capillary: 81 mg/dL (ref 65–99)

## 2014-07-18 LAB — POCT PREGNANCY, URINE: Preg Test, Ur: NEGATIVE

## 2014-07-18 SURGERY — LAPAROSCOPIC GASTRIC RESTRICTIVE DUODENAL PROCEDURE (DUODENAL SWITCH)
Anesthesia: General

## 2014-07-18 MED ORDER — CEFAZOLIN SODIUM-DEXTROSE 2-3 GM-% IV SOLR
INTRAVENOUS | Status: AC
  Administered 2014-07-18: 2 g via INTRAVENOUS
  Filled 2014-07-18: qty 50

## 2014-07-18 MED ORDER — ACETAMINOPHEN 10 MG/ML IV SOLN
INTRAVENOUS | Status: AC
  Filled 2014-07-18: qty 100

## 2014-07-18 MED ORDER — SODIUM CHLORIDE 0.9 % IV SOLN
INTRAVENOUS | Status: DC
  Administered 2014-07-18: 15:00:00 via INTRAVENOUS

## 2014-07-18 MED ORDER — NEOSTIGMINE METHYLSULFATE 10 MG/10ML IV SOLN
INTRAVENOUS | Status: DC | PRN
  Administered 2014-07-18: 3 mg via INTRAVENOUS

## 2014-07-18 MED ORDER — SCOPOLAMINE 1 MG/3DAYS TD PT72
1.0000 | MEDICATED_PATCH | TRANSDERMAL | Status: DC
  Administered 2014-07-18: 1.5 mg via TRANSDERMAL

## 2014-07-18 MED ORDER — PROMETHAZINE HCL 25 MG/ML IJ SOLN: 12.5000 mg | Freq: Four times a day (QID) | INTRAMUSCULAR | Status: DC | PRN

## 2014-07-18 MED ORDER — BUPIVACAINE-EPINEPHRINE 0.25% -1:200000 IJ SOLN
INTRAMUSCULAR | Status: DC | PRN
  Administered 2014-07-18: 60 mL

## 2014-07-18 MED ORDER — ROCURONIUM BROMIDE 100 MG/10ML IV SOLN
INTRAVENOUS | Status: DC | PRN
  Administered 2014-07-18: 35 mg via INTRAVENOUS
  Administered 2014-07-18: 5 mg via INTRAVENOUS
  Administered 2014-07-18 (×4): 10 mg via INTRAVENOUS

## 2014-07-18 MED ORDER — SODIUM CHLORIDE 0.9 % IV SOLN
INTRAVENOUS | Status: DC
  Administered 2014-07-18 (×2): via INTRAVENOUS

## 2014-07-18 MED ORDER — SCOPOLAMINE 1 MG/3DAYS TD PT72
MEDICATED_PATCH | TRANSDERMAL | Status: AC
  Administered 2014-07-18: 1.5 mg via TRANSDERMAL
  Filled 2014-07-18: qty 1

## 2014-07-18 MED ORDER — FENTANYL CITRATE (PF) 100 MCG/2ML IJ SOLN
INTRAMUSCULAR | Status: AC
  Administered 2014-07-18: 25 ug via INTRAVENOUS
  Filled 2014-07-18: qty 2

## 2014-07-18 MED ORDER — PANTOPRAZOLE SODIUM 40 MG IV SOLR
40.0000 mg | Freq: Every day | INTRAVENOUS | Status: DC
  Administered 2014-07-18: 40 mg via INTRAVENOUS
  Filled 2014-07-18: qty 40

## 2014-07-18 MED ORDER — HYDROCODONE-ACETAMINOPHEN 7.5-325 MG/15ML PO SOLN
5.0000 mL | ORAL | Status: DC | PRN
  Administered 2014-07-19 (×2): 10 mL via ORAL
  Filled 2014-07-18 (×3): qty 15

## 2014-07-18 MED ORDER — ENOXAPARIN SODIUM 30 MG/0.3ML ~~LOC~~ SOLN: 30.0000 mg | Freq: Two times a day (BID) | SUBCUTANEOUS | Status: DC

## 2014-07-18 MED ORDER — LIDOCAINE HCL (CARDIAC) 20 MG/ML IV SOLN
INTRAVENOUS | Status: DC | PRN
  Administered 2014-07-18: 60 mg via INTRAVENOUS

## 2014-07-18 MED ORDER — DEXAMETHASONE SODIUM PHOSPHATE 4 MG/ML IJ SOLN
INTRAMUSCULAR | Status: DC | PRN
  Administered 2014-07-18: 5 mg via INTRAVENOUS

## 2014-07-18 MED ORDER — ACETAMINOPHEN 160 MG/5ML PO SOLN: 650.0000 mg | ORAL | Status: DC | PRN

## 2014-07-18 MED ORDER — ONDANSETRON HCL 4 MG/2ML IJ SOLN
4.0000 mg | Freq: Once | INTRAMUSCULAR | Status: AC | PRN
  Administered 2014-07-18: 4 mg via INTRAVENOUS

## 2014-07-18 MED ORDER — ONDANSETRON HCL 4 MG/2ML IJ SOLN
4.0000 mg | INTRAMUSCULAR | Status: DC | PRN
  Administered 2014-07-19 (×3): 4 mg via INTRAVENOUS
  Filled 2014-07-18 (×3): qty 2

## 2014-07-18 MED ORDER — FENTANYL CITRATE (PF) 100 MCG/2ML IJ SOLN
25.0000 ug | INTRAMUSCULAR | Status: DC | PRN
  Administered 2014-07-18: 25 ug via INTRAVENOUS

## 2014-07-18 MED ORDER — POTASSIUM CHLORIDE IN NACL 20-0.45 MEQ/L-% IV SOLN
INTRAVENOUS | Status: DC
  Administered 2014-07-18 – 2014-07-20 (×7): via INTRAVENOUS
  Filled 2014-07-18 (×9): qty 1000

## 2014-07-18 MED ORDER — PROPOFOL 10 MG/ML IV BOLUS
INTRAVENOUS | Status: DC | PRN
  Administered 2014-07-18: 150 mg via INTRAVENOUS

## 2014-07-18 MED ORDER — ACETAMINOPHEN 10 MG/ML IV SOLN
1000.0000 mg | Freq: Four times a day (QID) | INTRAVENOUS | Status: AC
  Administered 2014-07-18 – 2014-07-19 (×2): 1000 mg via INTRAVENOUS
  Filled 2014-07-18 (×2): qty 100

## 2014-07-18 MED ORDER — CEFAZOLIN SODIUM-DEXTROSE 2-3 GM-% IV SOLR
2.0000 g | Freq: Once | INTRAVENOUS | Status: AC
  Administered 2014-07-18: 2 g via INTRAVENOUS

## 2014-07-18 MED ORDER — SUCCINYLCHOLINE CHLORIDE 20 MG/ML IJ SOLN
INTRAMUSCULAR | Status: DC | PRN
  Administered 2014-07-18: 100 mg via INTRAVENOUS

## 2014-07-18 MED ORDER — ALBUTEROL SULFATE (2.5 MG/3ML) 0.083% IN NEBU
2.5000 mg | INHALATION_SOLUTION | Freq: Four times a day (QID) | RESPIRATORY_TRACT | Status: DC | PRN
Start: 2014-07-18 — End: 2014-07-20

## 2014-07-18 MED ORDER — ONDANSETRON HCL 4 MG/2ML IJ SOLN
INTRAMUSCULAR | Status: AC
  Administered 2014-07-18: 4 mg via INTRAVENOUS
  Filled 2014-07-18: qty 2

## 2014-07-18 MED ORDER — FENTANYL CITRATE (PF) 100 MCG/2ML IJ SOLN
INTRAMUSCULAR | Status: DC | PRN
  Administered 2014-07-18: 100 ug via INTRAVENOUS
  Administered 2014-07-18 (×3): 50 ug via INTRAVENOUS

## 2014-07-18 MED ORDER — GLYCOPYRROLATE 0.2 MG/ML IJ SOLN
INTRAMUSCULAR | Status: DC | PRN
  Administered 2014-07-18: 0.2 mg via INTRAVENOUS
  Administered 2014-07-18: 0.4 mg via INTRAVENOUS

## 2014-07-18 MED ORDER — DIPHENHYDRAMINE HCL 50 MG/ML IJ SOLN: 25.0000 mg | Freq: Four times a day (QID) | INTRAMUSCULAR | Status: DC | PRN

## 2014-07-18 MED ORDER — HYDROMORPHONE HCL 1 MG/ML IJ SOLN
1.0000 mg | INTRAMUSCULAR | Status: DC | PRN
  Administered 2014-07-18: 1 mg via INTRAVENOUS
  Administered 2014-07-19: 2 mg via INTRAVENOUS
  Filled 2014-07-18: qty 1
  Filled 2014-07-18: qty 2

## 2014-07-18 MED ORDER — BUPIVACAINE-EPINEPHRINE (PF) 0.25% -1:200000 IJ SOLN
INTRAMUSCULAR | Status: AC
  Filled 2014-07-18: qty 60

## 2014-07-18 SURGICAL SUPPLY — 61 items
APPLIER CLIP ROT 10 11.4 M/L (STAPLE)
APR CLP MED LRG 11.4X10 (STAPLE)
BANDAGE ELASTIC 6 CLIP NS LF (GAUZE/BANDAGES/DRESSINGS) ×6 IMPLANT
BLADE SURG SZ11 CARB STEEL (BLADE) ×3 IMPLANT
CANISTER SUCT 1200ML W/VALVE (MISCELLANEOUS) ×3 IMPLANT
CATH TRAY 16F METER LATEX (MISCELLANEOUS) ×3 IMPLANT
CHLORAPREP W/TINT 26ML (MISCELLANEOUS) ×6 IMPLANT
CLIP APPLIE ROT 10 11.4 M/L (STAPLE) IMPLANT
DEFOGGER SCOPE WARMER CLEARIFY (MISCELLANEOUS) ×3 IMPLANT
DRAPE UTILITY 15X26 TOWEL STRL (DRAPES) ×6 IMPLANT
FILTER LAP SMOKE EVAC STRL (MISCELLANEOUS) ×3 IMPLANT
GLOVE BIO SURGEON STRL SZ7 (GLOVE) IMPLANT
GLOVE BIO SURGEON STRL SZ7.5 (GLOVE) ×12 IMPLANT
GLOVE EXAM NITRILE PF MED BLUE (GLOVE) ×3 IMPLANT
GLOVE INDICATOR 7.0 STRL GRN (GLOVE) ×4 IMPLANT
GOWN STRL REUS W/ TWL LRG LVL3 (GOWN DISPOSABLE) ×3 IMPLANT
GOWN STRL REUS W/ TWL XL LVL3 (GOWN DISPOSABLE) ×2 IMPLANT
GOWN STRL REUS W/TWL LRG LVL3 (GOWN DISPOSABLE) ×6
GOWN STRL REUS W/TWL XL LVL3 (GOWN DISPOSABLE) ×6
GRASPER 5MX38CM  BARIATRI (MISCELLANEOUS) IMPLANT
GRASPER SUT TROCAR 14GX15 (MISCELLANEOUS) ×3 IMPLANT
IRRIGATION STRYKERFLOW (MISCELLANEOUS) ×1 IMPLANT
IRRIGATOR STRYKERFLOW (MISCELLANEOUS) ×3
IV NS 1000ML (IV SOLUTION) ×3
IV NS 1000ML BAXH (IV SOLUTION) ×1 IMPLANT
KIT RM TURNOVER STRD PROC AR (KITS) ×3 IMPLANT
LABEL OR SOLS (LABEL) ×3 IMPLANT
LIQUID BAND (GAUZE/BANDAGES/DRESSINGS) ×3 IMPLANT
NDL SAFETY 22GX1.5 (NEEDLE) ×3 IMPLANT
NS IRRIG 1000ML POUR BTL (IV SOLUTION) ×3 IMPLANT
PACK LAP CHOLECYSTECTOMY (MISCELLANEOUS) ×3 IMPLANT
RELOAD BLUE (STAPLE) ×12 IMPLANT
RELOAD GOLD (STAPLE) ×12 IMPLANT
RELOAD STAPLER BLUE 60MM (STAPLE) ×1 IMPLANT
RELOAD STAPLER GOLD 60MM (STAPLE) ×3 IMPLANT
RELOAD STAPLER GREEN 60MM (STAPLE) ×1 IMPLANT
RELOAD STAPLER WHITE 60MM (STAPLE) ×4 IMPLANT
SHEARS HARMONIC ACE PLUS 45CM (MISCELLANEOUS) ×3 IMPLANT
SLEEVE ENDOPATH XCEL 5M (ENDOMECHANICALS) ×9 IMPLANT
SLEEVE GASTRECTOMY 40FR VISIGI (MISCELLANEOUS) ×3 IMPLANT
STAPLER ECHELON LONG 60 440 (INSTRUMENTS) ×3 IMPLANT
STAPLER RELOAD BLUE 60MM (STAPLE) ×3
STAPLER RELOAD GOLD 60MM (STAPLE) ×9
STAPLER RELOAD GREEN 60MM (STAPLE) ×3
STAPLER RELOAD WHITE 60MM (STAPLE) ×12
SUT DEVICE BRAIDED 0X39 (SUTURE) ×9 IMPLANT
SUT DEVICE BRAIDED 2.0X39 (SUTURE) ×18 IMPLANT
SUT DVC ABSORB BRAID 3.0X39 (SUTURE) ×6 IMPLANT
SUT DVC VICRYL PGA 2.0X39 (SUTURE) ×3 IMPLANT
SUT MNCRL AB 4-0 PS2 18 (SUTURE) ×6 IMPLANT
SUT VIC AB 0 UR5 27 (SUTURE) IMPLANT
SUT VIC AB 3-0 SH 27 (SUTURE) ×3
SUT VIC AB 3-0 SH 27X BRD (SUTURE) ×1 IMPLANT
SUT VICRYL/POLYSORB 3.0 (SUTURE) ×6 IMPLANT
SYR 20CC LL (SYRINGE) ×3 IMPLANT
TROCAR 5M 150ML BLDLS (TROCAR) IMPLANT
TROCAR BLADELESS 15MM (ENDOMECHANICALS) IMPLANT
TROCAR XCEL 12X100 BLDLESS (ENDOMECHANICALS) ×3 IMPLANT
TROCAR XCEL NON-BLD 5MMX100MML (ENDOMECHANICALS) ×3 IMPLANT
TUBING INSUFFLATOR HEATED (MISCELLANEOUS) ×3 IMPLANT
WATER STERILE IRR 1000ML POUR (IV SOLUTION) ×3 IMPLANT

## 2014-07-18 NOTE — Interval H&P Note (Signed)
History and Physical Interval Note:  07/18/2014 3:16 PM  Karen Charles  has presented today for surgery, with the diagnosis of morbid obesity  The various methods of treatment have been discussed with the patient and family. After consideration of risks, benefits and other options for treatment, the patient has consented to  Procedure(s): LAPAROSCOPIC GASTRIC RESTRICTIVE DUODENAL PROCEDURE (DUODENAL SWITCH) (N/A) ESOPHAGOGASTRODUODENOSCOPY (EGD)///possible (N/A) as a surgical intervention .  The patient's history has been reviewed, patient examined, no change in status, stable for surgery.  I have reviewed the patient's chart and labs.  Questions were answered to the patient's satisfaction.     Everette Rankyner, Palyn Scrima A

## 2014-07-18 NOTE — Anesthesia Procedure Notes (Signed)
Procedure Name: Intubation Date/Time: 07/18/2014 4:05 PM Performed by: Lily KocherPERALTA, Karen Aerts Pre-anesthesia Checklist: Patient identified, Emergency Drugs available, Suction available, Patient being monitored and Timeout performed Patient Re-evaluated:Patient Re-evaluated prior to inductionOxygen Delivery Method: Circle system utilized Preoxygenation: Pre-oxygenation with 100% oxygen Intubation Type: IV induction Ventilation: Mask ventilation without difficulty Laryngoscope Size: Mac and 4 Grade View: Grade I Tube type: Oral Tube size: 7.5 mm Number of attempts: 1 Placement Confirmation: ETT inserted through vocal cords under direct vision,  positive ETCO2 and breath sounds checked- equal and bilateral Secured at: 22 cm Tube secured with: Tape Dental Injury: Teeth and Oropharynx as per pre-operative assessment

## 2014-07-18 NOTE — Brief Op Note (Signed)
07/18/2014  7:18 PM  PATIENT:  Ubaldo Glassinghristie D Gailey  44 y.o. female  PRE-OPERATIVE DIAGNOSIS:  morbid obesity  POST-OPERATIVE DIAGNOSIS:  Same with Moderate-sized Hiatal Hernia  PROCEDURE:  Procedure(s): LAPAROSCOPIC GASTRIC RESTRICTIVE DUODENAL PROCEDURE (DUODENAL SWITCH/BPD), DUAL ANASTOMOSES  HIATAL HERNIA REPAIR  SURGEON:  Surgeon(s) and Role:    * Everette RankMichael A Tyner, MD - Primary  PHYSICIAN ASSISTANT: Jazmin Ley, PA-C  ANESTHESIA:   general  EBL:     BLOOD ADMINISTERED:none  DRAINS: none   LOCAL MEDICATIONS USED:  MARCAINE     SPECIMEN:  Source of Specimen:  Gastric SLEEVE, Resected  DISPOSITION OF SPECIMEN:  PATHOLOGY  COUNTS:  YES  DICTATION: .Note written in EPIC  PLAN OF CARE: Admit to inpatient   PATIENT DISPOSITION:  PACU - hemodynamically stable.   Delay start of Pharmacological VTE agent (>24hrs) due to surgical blood loss or risk of bleeding: no

## 2014-07-18 NOTE — H&P (Signed)
History and physical on paper chart, no change in exam or history.

## 2014-07-18 NOTE — Op Note (Signed)
PATIENT: Karen GlassingChristie D Charles 03-22-1970  PROCEDURE PERFORMED: Procedure(s) with comments: LAPAROSCOPIC GASTRIC RESTRICTIVE DUODENAL PROCEDURE (DUODENAL SWITCH) (N/A) - Duodenal switch with repair biliary pancreatic diversion and repair of hiatal hernia PRE-OP DIAGNOSIS: morbid obesity with GERD POST-OP DIAGNOSIS: same ESTIMATED BLOOD LOSS: less than 50 mL SURGEON: Effie Shyyner, Michael A  ASSISTANT: Tilden Fossaon Drinkwater,PA  PROCEDURE NOTE: The patient was brought to the operating room and placed in the supine position. General anesthesia was obtained with orotracheal intubation. Foley catheter inserted sterilely. TED hose and Thromboguards applied. A foot board applied at the enatd of the operative bed. The chest and abdomen were sterilely prepped and draped. A 5 mm Optiview trocar introduced under direct visualization in the left upper quadrant of the abdomen. Pneumoperitoneum obtained. Four additional trocars introduced across the upper abdomen. The cecum and distal ileum identified. The small bowel was then followed approximate 300 cm, at which point it was secured to the gastrocolic ligament. A Nathanson liver retractor was introduced followed by elevation of the left lobe of the liver.A small hiatal hernia noted and consistent with preoperative symptoms a repair was felt to be appropriate.There was noted to be a moderate size indentation in the area of the hiatus consistent with hiatal hernia as noted on preoperative upper GI series. It was decided to proceed with repair of this in an effort to diminish the risk of gastroesophageal reflux disease following creation of sleeve gastrectomy. The patient had division of the gastrohepatic ligament and the peritoneum across the anterior hiatus. Blunt dissection was used to initiate separation of the anterior esophagus and anterior vagal nerve away from the overlying pericardium. The peritoneum was incised just lateral to the right crus and blunt dissection used to reduce  herniated lesser sac fatty tissue. The posterior vagal nerve and posterior esophagus then elevated from the underlying aorta and aspects of the right pleura. The upper stomach freed from the undersurface of the left hemidiaphragm and also phrenoesophageal ligaments divided along the medial margin of the left crural musculature. Additional blunt dissection was then performed circumferential to the esophagus, dividing the esophagus from the pleural surfaces on the right and the left side and additional mobilization from the aorta and overlying pericardium. This was extended into the lower mediastinum over a distance of approximately 6-7 cm resulting in delivery of 2 cm of esophagus lying comfortably in the abdominal cavity. A posterior crural repair then performed with 2 interrupted 0 Ethibond sutures  . The patient then had identification and marking of the pylorus. Beginning approximately 4 cm proximal to this there was division of vascular pedicles along the greater curvature of the stomach, this was extended cephalad over a distance of approximately 8 cm. Creation of a gastric sleeve effect was then initiated. The patient had a series of GI staple firings placed creating a medially based gastric tube effect. The first firing was a black load stapler placed in a relative transverse direction as was the next gold load firing. These were positioned in a way to avoid narrowing in the region of the incisura. Next a more vertical line of staples was placed parallel to the lesser curvature and ultimately brought out just lateral to the angle of His. As this was done the 7840 [place] ViSiGi device which was placed in the antral region was gradually withdrawn to helped create a consistent tubular effect. The lateral stomach then freed from the gastrocolic and gastrosplenic ligament and removed via the right upper quadrant 12 mm trocar site. The residual vascular pedicles along  the lower inner curve of the duodenum bulb were  divided by use of the Harmonic scalpel, the peritoneum being scored immediately before this and also the lateral peritoneum also scored. Blunt dissection behind the duodenum divided by use of the Harmonic scalpel. This was ultimately brought out lateral to the duodenum, approximately 3 cm inferior to the pylorus.  The patient then had a blue load stapler introduced across the duodenum again approximately 3 cm inferior to the pylorus. This was used to divide the duodenum with excellent hemostasis. Several residual vascular pedicles on the lateral aspect of the duodenum divided, allowing this to be brought more to the area of the midline. The patient then had an ileal-duodenal anastomosis constructed. This was done with securing seromuscular layers of the distal duodenal staple line area and the antimesenteric border of the ileum. Enterotomies then made on the anterior aspect of the duodenum and opposing portion of the ileum. A full-thickness running 3-0 Polysorb suture used to complete the anastomosis, the suture line then reinforced anteriorly with an additional running 2-0 Polysorb suture. The intestine was occluded distally and insufflation of the gastric tube in area of anastomosis performed. A saline bath performed, no air leak identified. At this point the ViSiGi device was withdrawn.  The jejunum immediate proximal to the duodenal anastomosis was divided with white load gia stapler along with partial division of mesentery with harmonic scalpel. The alimentary limb was followed distally 50 cm at which point a jejunal jejunal anastomosis created between the new biliary limb and the common channel. This was created by an enterotomy on the antimesenteric margin of each portions of bowel followed by a white load staple firing at 35 mm to creat a common lumen. The resulting enterotomy was closed with a white load staple. Antitorsion sutures placed distal to the anastomosis and the mesenteric window closed with 2.0  surgidec suture.A portion of the divided gastrocolic ligament was secured to the lateral margins of the gastric sleeve to assure a consistent, somewhat rounded entry into the distal stomach. At this point the fascia and peritoneum of the 12 mm trocar site was closed with 0 Vicryl suture as passed by a needle suture passing system under direct visualization. The pneumoperitoneum relieved. The trocars removed. The wounds injected with 0.25% Marcaine and closed with 4-0 Monocryl in the dermis followed by Dermabond.

## 2014-07-18 NOTE — Progress Notes (Signed)
Clarified with Dr. Alva Garnetyner if patient needs UGI done before she takes in any PO liquids/meds.  He stated to let patient start diet now and have her receive PO meds starting tomorrow.

## 2014-07-18 NOTE — Anesthesia Preprocedure Evaluation (Signed)
Anesthesia Evaluation  Patient identified by MRN, date of birth, ID band Patient awake    Reviewed: Allergy & Precautions, NPO status , Patient's Chart, lab work & pertinent test results, reviewed documented beta blocker date and time   Airway Mallampati: II  TM Distance: >3 FB     Dental  (+) Chipped   Pulmonary asthma ,          Cardiovascular     Neuro/Psych  Headaches,    GI/Hepatic GERD-  ,  Endo/Other  diabetes, Type 2  Renal/GU      Musculoskeletal   Abdominal   Peds  Hematology   Anesthesia Other Findings   Reproductive/Obstetrics                             Anesthesia Physical Anesthesia Plan  ASA: II  Anesthesia Plan: General   Post-op Pain Management:    Induction: Intravenous  Airway Management Planned: Oral ETT  Additional Equipment:   Intra-op Plan:   Post-operative Plan:   Informed Consent: I have reviewed the patients History and Physical, chart, labs and discussed the procedure including the risks, benefits and alternatives for the proposed anesthesia with the patient or authorized representative who has indicated his/her understanding and acceptance.     Plan Discussed with: CRNA  Anesthesia Plan Comments:         Anesthesia Quick Evaluation

## 2014-07-18 NOTE — Anesthesia Postprocedure Evaluation (Signed)
  Anesthesia Post-op Note  Patient: Karen Charles  Procedure(s) Performed: Procedure(s) with comments: LAPAROSCOPIC GASTRIC RESTRICTIVE DUODENAL PROCEDURE (DUODENAL SWITCH) (N/A) - Duodenal switch with repair biliary pancreatic diversion and repair of hiatal hernia  Anesthesia type:General  Patient location: PACU  Post pain: Pain level controlled  Post assessment: Post-op Vital signs reviewed, Patient's Cardiovascular Status Stable, Respiratory Function Stable, Patent Airway and No signs of Nausea or vomiting  Post vital signs: Reviewed and stable  Last Vitals:  Filed Vitals:   07/18/14 2049  BP: 114/71  Pulse: 88  Temp: 36.7 C  Resp: 20    Level of consciousness: awake, alert  and patient cooperative  Complications: No apparent anesthesia complications

## 2014-07-18 NOTE — Transfer of Care (Signed)
Immediate Anesthesia Transfer of Care Note  Patient: Karen GlassingChristie D Charles  Procedure(s) Performed: Procedure(s) with comments: LAPAROSCOPIC GASTRIC RESTRICTIVE DUODENAL PROCEDURE (DUODENAL SWITCH) (N/A) - Duodenal switch with repair biliary pancreatic diversion and repair of hiatal hernia  Patient Location: PACU  Anesthesia Type:General  Level of Consciousness: awake and sedated  Airway & Oxygen Therapy: Patient Spontanous Breathing and Patient connected to face mask oxygen  Post-op Assessment: Report given to RN and Post -op Vital signs reviewed and stable  Post vital signs: Reviewed and stable  Last Vitals:  Filed Vitals:   07/18/14 1427  BP: 120/75  Pulse: 83  Temp: 37.1 C  Resp: 16    Complications: No apparent anesthesia complications

## 2014-07-19 ENCOUNTER — Encounter: Payer: Self-pay | Admitting: Bariatrics

## 2014-07-19 LAB — CBC WITH DIFFERENTIAL/PLATELET
BASOS ABS: 0 10*3/uL (ref 0–0.1)
BASOS PCT: 0 %
EOS ABS: 0 10*3/uL (ref 0–0.7)
Eosinophils Relative: 0 %
HCT: 38.7 % (ref 35.0–47.0)
HEMOGLOBIN: 13 g/dL (ref 12.0–16.0)
Lymphocytes Relative: 10 %
Lymphs Abs: 0.9 10*3/uL — ABNORMAL LOW (ref 1.0–3.6)
MCH: 28.6 pg (ref 26.0–34.0)
MCHC: 33.6 g/dL (ref 32.0–36.0)
MCV: 85.1 fL (ref 80.0–100.0)
MONO ABS: 0.4 10*3/uL (ref 0.2–0.9)
MONOS PCT: 5 %
NEUTROS ABS: 7.7 10*3/uL — AB (ref 1.4–6.5)
Neutrophils Relative %: 85 %
Platelets: 188 10*3/uL (ref 150–440)
RBC: 4.55 MIL/uL (ref 3.80–5.20)
RDW: 13.8 % (ref 11.5–14.5)
WBC: 9 10*3/uL (ref 3.6–11.0)

## 2014-07-19 LAB — COMPREHENSIVE METABOLIC PANEL
ALT: 51 U/L (ref 14–54)
ANION GAP: 3 — AB (ref 5–15)
AST: 45 U/L — ABNORMAL HIGH (ref 15–41)
Albumin: 3.5 g/dL (ref 3.5–5.0)
Alkaline Phosphatase: 53 U/L (ref 38–126)
BUN: 11 mg/dL (ref 6–20)
CO2: 24 mmol/L (ref 22–32)
CREATININE: 1.01 mg/dL — AB (ref 0.44–1.00)
Calcium: 8.1 mg/dL — ABNORMAL LOW (ref 8.9–10.3)
Chloride: 111 mmol/L (ref 101–111)
GFR calc non Af Amer: >60 mL/min (ref >60)
GLUCOSE: 126 mg/dL — AB (ref 65–99)
POTASSIUM: 4.7 mmol/L (ref 3.5–5.1)
Sodium: 138 mmol/L (ref 135–145)
TOTAL PROTEIN: 7.1 g/dL (ref 6.5–8.1)
Total Bilirubin: 0.7 mg/dL (ref 0.3–1.2)

## 2014-07-19 MED ORDER — PANTOPRAZOLE SODIUM 40 MG IV SOLR
40.0000 mg | Freq: Two times a day (BID) | INTRAVENOUS | Status: DC
  Administered 2014-07-19 – 2014-07-20 (×2): 40 mg via INTRAVENOUS
  Filled 2014-07-19 (×2): qty 40

## 2014-07-19 MED ORDER — ENOXAPARIN SODIUM 40 MG/0.4ML ~~LOC~~ SOLN
40.0000 mg | Freq: Two times a day (BID) | SUBCUTANEOUS | Status: DC
  Administered 2014-07-19 – 2014-07-20 (×3): 40 mg via SUBCUTANEOUS
  Filled 2014-07-19 (×3): qty 0.4

## 2014-07-19 MED ORDER — CETYLPYRIDINIUM CHLORIDE 0.05 % MT LIQD
7.0000 mL | Freq: Two times a day (BID) | OROMUCOSAL | Status: DC
  Filled 2014-07-19: qty 7

## 2014-07-19 MED ORDER — METHYLPREDNISOLONE SODIUM SUCC 125 MG IJ SOLR
60.0000 mg | Freq: Four times a day (QID) | INTRAMUSCULAR | Status: AC
  Administered 2014-07-19 – 2014-07-20 (×3): 60 mg via INTRAVENOUS
  Filled 2014-07-19 (×3): qty 2

## 2014-07-19 NOTE — Progress Notes (Addendum)
Pt admitted to [facility] from PACU.  Alert and oriented. VSS but remains on O2@3lpm  d/t pt desatting at times in the night. Sats currently in low to mid 90s.   Encouraging fluid intake and ambulation.  Pt ambulated 1 lap around nurses station.  Dilaudid given once for pain. Zofran given once.  Daughter at bedside. Foley d/c'd at 2320.  Pt waiting to void at this time.  Will cont to monitor.

## 2014-07-19 NOTE — Progress Notes (Signed)
Subjective: Interval History: has no complaint of delayed emptying with swallowing and nausea. Pain controlled. Voiding without foley.   Objective: Vital signs in last 24 hours: Temp:  [98 F (36.7 C)-99.1 F (37.3 C)] 99.1 F (37.3 C) (06/29 1552) Pulse Rate:  [74-121] 74 (06/29 1552) Resp:  [16-36] 16 (06/29 1552) BP: (103-142)/(51-80) 125/73 mmHg (06/29 1552) SpO2:  [92 %-98 %] 92 % (06/29 1552) Weight:  [128.958 kg (284 lb 4.8 oz)] 128.958 kg (284 lb 4.8 oz) (06/29 0319)  Intake/Output from previous day: 06/28 0701 - 06/29 0700 In: 3166 [P.O.:60; I.V.:3106] Out: 965 [Urine:935] Intake/Output this shift: Total I/O In: 1897 [P.O.:240; I.V.:1657] Out: 0   General appearance: alert, cooperative and no distress Lungs: clear to auscultation bilaterally Abdomen: soft, non-tender; bowel sounds normal; no masses,  no organomegaly  Results for orders placed or performed during the hospital encounter of 07/18/14 (from the past 24 hour(s))  Glucose, capillary     Status: Abnormal   Collection Time: 07/18/14  8:23 PM  Result Value Ref Range   Glucose-Capillary 177 (H) 65 - 99 mg/dL  CBC     Status: Abnormal   Collection Time: 07/18/14  9:52 PM  Result Value Ref Range   WBC 12.4 (H) 3.6 - 11.0 K/uL   RBC 4.74 3.80 - 5.20 MIL/uL   Hemoglobin 13.3 12.0 - 16.0 g/dL   HCT 40.940.3 81.135.0 - 91.447.0 %   MCV 85.1 80.0 - 100.0 fL   MCH 28.0 26.0 - 34.0 pg   MCHC 32.9 32.0 - 36.0 g/dL   RDW 78.213.8 95.611.5 - 21.314.5 %   Platelets 185 150 - 440 K/uL  Creatinine, serum     Status: Abnormal   Collection Time: 07/18/14  9:52 PM  Result Value Ref Range   Creatinine, Ser 1.22 (H) 0.44 - 1.00 mg/dL   GFR calc non Af Amer 54 (L) >60 mL/min   GFR calc Af Amer >60 >60 mL/min  CBC WITH DIFFERENTIAL     Status: Abnormal   Collection Time: 07/19/14  4:29 AM  Result Value Ref Range   WBC 9.0 3.6 - 11.0 K/uL   RBC 4.55 3.80 - 5.20 MIL/uL   Hemoglobin 13.0 12.0 - 16.0 g/dL   HCT 08.638.7 57.835.0 - 46.947.0 %   MCV 85.1  80.0 - 100.0 fL   MCH 28.6 26.0 - 34.0 pg   MCHC 33.6 32.0 - 36.0 g/dL   RDW 62.913.8 52.811.5 - 41.314.5 %   Platelets 188 150 - 440 K/uL   Neutrophils Relative % 85 %   Neutro Abs 7.7 (H) 1.4 - 6.5 K/uL   Lymphocytes Relative 10 %   Lymphs Abs 0.9 (L) 1.0 - 3.6 K/uL   Monocytes Relative 5 %   Monocytes Absolute 0.4 0.2 - 0.9 K/uL   Eosinophils Relative 0 %   Eosinophils Absolute 0.0 0 - 0.7 K/uL   Basophils Relative 0 %   Basophils Absolute 0.0 0 - 0.1 K/uL  Comprehensive metabolic panel     Status: Abnormal   Collection Time: 07/19/14  4:29 AM  Result Value Ref Range   Sodium 138 135 - 145 mmol/L   Potassium 4.7 3.5 - 5.1 mmol/L   Chloride 111 101 - 111 mmol/L   CO2 24 22 - 32 mmol/L   Glucose, Bld 126 (H) 65 - 99 mg/dL   BUN 11 6 - 20 mg/dL   Creatinine, Ser 2.441.01 (H) 0.44 - 1.00 mg/dL   Calcium 8.1 (L) 8.9 - 10.3 mg/dL  Total Protein 7.1 6.5 - 8.1 g/dL   Albumin 3.5 3.5 - 5.0 g/dL   AST 45 (H) 15 - 41 U/L   ALT 51 14 - 54 U/L   Alkaline Phosphatase 53 38 - 126 U/L   Total Bilirubin 0.7 0.3 - 1.2 mg/dL   GFR calc non Af Amer >60 >60 mL/min   GFR calc Af Amer >60 >60 mL/min   Anion gap 3 (L) 5 - 15    Studies/Results: No results found.  Scheduled Meds: . antiseptic oral rinse  7 mL Mouth Rinse BID  . enoxaparin (LOVENOX) injection  40 mg Subcutaneous Q12H  . pantoprazole (PROTONIX) IV  40 mg Intravenous QHS  . scopolamine  1 patch Transdermal Q72H   Continuous Infusions: . 0.45 % NaCl with KCl 20 mEq / L 125 mL/hr at 07/19/14 1733   PRN Meds:[START ON 07/20/2014] acetaminophen (TYLENOL) oral liquid 160 mg/5 mL, albuterol, diphenhydrAMINE, HYDROcodone-acetaminophen, HYDROmorphone (DILAUDID) injection, ondansetron (ZOFRAN) IV, promethazine  Assessment/Plan: Post op nausea with sense of dysphagia, poor po intake thus far. Will continue ivf and add solumedrol for 3 doses.   LOS: 1 day    Everette Rank

## 2014-07-19 NOTE — Progress Notes (Signed)
INTERVENTION:  RD consulted for nutrition education regarding inpatient bariatric surgery.   RD provided "The Liquid Diet" handout from the Bariatric Surgery Guide from the [facility]. This handout previously provided to patient prior to surgery is a duplicate copy. Discussed what foods/liquids are consistent with a Clear Liquid Diet and reinforced Key Concepts such as no carbonation, no caffeine, or sugar containing beverages. Provided methods to prevent dehydration and promote protein intake, using clock and sample fluid schedule. RD encouraged follow-up with outpatient dietitian after discharge.  Teach back method used.  Expect good compliance.  NUTRITION DIAGNOSIS:  Food and nutrition knowledge related deficit related to recent bariatric surgery as evidenced by dietitian consult for nutrition education   GOAL:  Patient will be able to sip and tolerate CL within 24-48 hours  MONITOR:  Energy intake Digestive system  ASSESSMENT:  Pt s/p duodenal switch.   Body mass index is 43.24 kg/(m^2).   Current diet order is bariatric clear liquids with unjury supplement TID, patient is consuming approximately 30ml q 15-20 minutes at this time.   Labs and medications reviewed.   LOW Care Level  Hafsah Hendler B. Freida BusmanAllen, RD, LDN [phone] (pager)

## 2014-07-20 LAB — CBC WITH DIFFERENTIAL/PLATELET
Basophils Absolute: 0 10*3/uL (ref 0–0.1)
Basophils Relative: 0 %
Eosinophils Absolute: 0 10*3/uL (ref 0–0.7)
Eosinophils Relative: 0 %
HCT: 39 % (ref 35.0–47.0)
Hemoglobin: 12.8 g/dL (ref 12.0–16.0)
Lymphocytes Relative: 14 %
Lymphs Abs: 0.9 10*3/uL — ABNORMAL LOW (ref 1.0–3.6)
MCH: 28.2 pg (ref 26.0–34.0)
MCHC: 32.7 g/dL (ref 32.0–36.0)
MCV: 86.2 fL (ref 80.0–100.0)
Monocytes Absolute: 0.1 10*3/uL — ABNORMAL LOW (ref 0.2–0.9)
Monocytes Relative: 1 %
NEUTROS PCT: 85 %
Neutro Abs: 5.5 10*3/uL (ref 1.4–6.5)
Platelets: 178 10*3/uL (ref 150–440)
RBC: 4.53 MIL/uL (ref 3.80–5.20)
RDW: 13.7 % (ref 11.5–14.5)
WBC: 6.5 10*3/uL (ref 3.6–11.0)

## 2014-07-20 MED ORDER — UNJURY CHICKEN SOUP POWDER: 1.0000 | Freq: Three times a day (TID) | ORAL | Status: DC

## 2014-07-20 MED ORDER — HYDROCODONE-ACETAMINOPHEN 7.5-325 MG/15ML PO SOLN: 5.0000 mL | ORAL | Status: DC | PRN

## 2014-07-20 MED ORDER — ENOXAPARIN SODIUM 40 MG/0.4ML ~~LOC~~ SOLN: 40.0000 mg | Freq: Two times a day (BID) | SUBCUTANEOUS | Status: DC

## 2014-07-20 NOTE — Progress Notes (Signed)
Pt d/c home; d/c instructions reviewed w/ pt; pt understanding was verbalized; IV removed catheter in tact, gauze dressing applied; all pt questions answered; pt left unit via wheelchair accompanied by staff 

## 2014-07-26 LAB — SURGICAL PATHOLOGY

## 2014-08-29 NOTE — Discharge Summary (Signed)
Physician Discharge Summary  Patient ID: Karen Charles MRN: [id number] DOB/AGE: 26-Feb-1970 44 y.o.  Admit date: 07/18/2014 Discharge date: 08/29/2014  Admission Diagnoses:morbid obesity with GERD and hiatal hernia  Discharge Diagnoses:  Active Problems:   Bariatric surgery status    Procedure(s) with comments: LAPAROSCOPIC GASTRIC RESTRICTIVE DUODENAL PROCEDURE (DUODENAL SWITCH) (N/A) - Duodenal switch with repair biliary pancreatic diversion and repair of hiatal hernia  Discharged Condition: good  Hospital Course: Tolerated operative procedure well. Her postoperative course associated with some early nausea which resolved with antiemetics and solumedrol. By the second postoperative day she had appropriate po intake and was active with ambulation.  Consults: None  Significant Diagnostic Studies: none3  Treatments: IV hydration  Discharge Exam: Blood pressure 109/58, pulse 79, temperature 98.3 F (36.8 C), temperature source Oral, resp. rate 18, height  (1.727 m), weight 128.958 kg (284 lb 4.8 oz), SpO2 94 %. Resp: clear to auscultation bilaterally GI: soft, non-tender; bowel sounds normal; no masses,  no organomegaly  Disposition: 01-Home or Self Care  Discharge Instructions    Ambulate hourly while awake    Complete by:  As directed      Call MD for:  difficulty breathing, headache or visual disturbances    Complete by:  As directed      Call MD for:  persistant dizziness or light-headedness    Complete by:  As directed      Call MD for:  persistant nausea and vomiting    Complete by:  As directed      Call MD for:  redness, tenderness, or signs of infection (pain, swelling, redness, odor or green/yellow discharge around incision site)    Complete by:  As directed      Call MD for:  severe uncontrolled pain    Complete by:  As directed      Call MD for:  temperature >101 F    Complete by:  As directed      Diet bariatric full liquid    Complete by:  As  directed      Discharge wound care:    Complete by:  As directed   shower prn     Incentive spirometry    Complete by:  As directed   Perform hourly while awake            Medication List    TAKE these medications        albuterol 108 (90 BASE) MCG/ACT inhaler  Commonly known as:  PROVENTIL HFA;VENTOLIN HFA  Inhale 1-2 puffs into the lungs every 6 (six) hours as needed for wheezing or shortness of breath.     cetirizine 10 MG tablet  Commonly known as:  ZYRTEC  Take 10 mg by mouth daily as needed for allergies.     enoxaparin 40 MG/0.4ML injection  Commonly known as:  LOVENOX  Inject 0.4 mLs (40 mg total) into the skin every 12 (twelve) hours.     HYDROcodone-acetaminophen 7.5-325 mg/15 ml solution  Commonly known as:  HYCET  Take 5-10 mLs by mouth every 4 (four) hours as needed for moderate pain or severe pain.           Follow-up Information    Follow up with Everette Rank, MD On 08/02/2014.   Specialty:  Bariatrics   Why:  10:10   Contact information:   [address] [place] [address] [phone]       Follow up with Everette Rank, MD On 08/11/2014.  Specialty:  Bariatrics   Why:  12:00   Contact information:   [address] [place] [address] [phone]       Follow up with Everette Rank, MD.   Specialty:  Bariatrics   Why:  10:10   Contact information:   [address] [place] [address] [phone]       Signed: Everette Rank 08/29/2014, 9:02 AM

## 2015-04-09 ENCOUNTER — Ambulatory Visit
Admission: EM | Admit: 2015-04-09 | Discharge: 2015-04-09 | Disposition: A | Discharge status: Home / Self Care | Payer: 59 | Attending: Emergency Medicine | Admitting: Emergency Medicine

## 2015-04-09 ENCOUNTER — Encounter: Payer: Self-pay | Admitting: *Deleted

## 2015-04-09 DIAGNOSIS — N390 Urinary tract infection, site not specified: Secondary | ICD-10-CM | POA: Diagnosis not present

## 2015-04-09 LAB — URINALYSIS COMPLETE WITH MICROSCOPIC (ARMC ONLY)
Bilirubin Urine: NEGATIVE
Glucose, UA: NEGATIVE mg/dL
Nitrite: POSITIVE — AB
Protein, ur: 100 mg/dL — AB
Specific Gravity, Urine: 1.015 (ref 1.005–1.030)
pH: 5.5 (ref 5.0–8.0)

## 2015-04-09 LAB — PREGNANCY, URINE: Preg Test, Ur: NEGATIVE

## 2015-04-09 MED ORDER — PHENAZOPYRIDINE HCL 200 MG PO TABS: 200.0000 mg | ORAL_TABLET | Freq: Three times a day (TID) | ORAL | Status: DC | PRN

## 2015-04-09 MED ORDER — SULFAMETHOXAZOLE-TRIMETHOPRIM 800-160 MG PO TABS: 1.0000 | ORAL_TABLET | Freq: Two times a day (BID) | ORAL | Status: DC

## 2015-04-09 NOTE — ED Provider Notes (Signed)
HPI  SUBJECTIVE:  Karen Charles is a 45 y.o. female who presents with urinary urgency, frequency, cloudy, oderous urine and  lower abdominal pain/pressure after urinating starting 6 days ago. C/o left lower back pain starting 2 days ago.reports chills today.  No aggravating or alleviating factors.  Tried increasing fluids without improvement.  No dysuria, nausea, vomiting, fever.  No hematuria.  No  other abdominal pain  No vaginal bleeding or discharge/odor, vulvar itching, genital rash. No recent antibiotic or perfumed body washes. Patient is in a monoagmous sexual relationship with her husband who is asxatic .  STDs are not a concern today. Patient has not taken antipyretic in the past 4-6 hours.  Pt  is not pregnant.   Past medical history of diabetes- does not need to check sugars, hypertension not requiring any medications, UTIs, pyelonephritis, nonobstructing nephrolithiasis, DVT, PE.  No h/o STD's, BV, yeast infections.  States this feels similar to previous UTIs. no UTI since last year. LMP 2 weeks ago.  PMD: Dr. Quillian Quince.   Past Medical History  Diagnosis Date  . Asthma   . GERD (gastroesophageal reflux disease)   . Headache   . Hx of blood clots   . Presence of IVC filter     inserted 07/12/2014  . Diabetes mellitus without complication [facility])     Past Surgical History  Procedure Laterality Date  . Cholecystectomy    . Tonsillectomy    . Dilation and curettage of uterus    . Laparoscopic gastric restrictive duodenal procedure (duodenal switch) N/A 07/18/2014    Procedure: LAPAROSCOPIC GASTRIC RESTRICTIVE DUODENAL PROCEDURE (DUODENAL SWITCH);  Surgeon: Everette Rank, MD;  Location: [facility] ORS;  Service: General;  Laterality: N/A;  Duodenal switch with repair biliary pancreatic diversion and repair of hiatal hernia    Family History  Problem Relation Age of Onset  . Lupus Mother   . Diabetes Father   . Congestive Heart Failure Father   . Hypertension Father     Social  History  Substance Use Topics  . Smoking status: Never Smoker   . Smokeless tobacco: Never Used  . Alcohol Use: No    No current facility-administered medications for this encounter.  Current outpatient prescriptions:  Marland Kitchen  Multiple Vitamin (MULTIVITAMIN) tablet, Take 1 tablet by mouth daily., Disp: , Rfl:  .  albuterol (PROVENTIL HFA;VENTOLIN HFA) 108 (90 BASE) MCG/ACT inhaler, Inhale 1-2 puffs into the lungs every 6 (six) hours as needed for wheezing or shortness of breath., Disp: , Rfl:  .  cetirizine (ZYRTEC) 10 MG tablet, Take 10 mg by mouth daily as needed for allergies., Disp: , Rfl:  .  phenazopyridine (PYRIDIUM) 200 MG tablet, Take 1 tablet (200 mg total) by mouth 3 (three) times daily as needed for pain., Disp: 6 tablet, Rfl: 0 .  sulfamethoxazole-trimethoprim (BACTRIM DS,SEPTRA DS) 800-160 MG tablet, Take 1 tablet by mouth 2 (two) times daily., Disp: 6 tablet, Rfl: 0 .  [DISCONTINUED] enoxaparin (LOVENOX) 40 MG/0.4ML injection, Inject 0.4 mLs (40 mg total) into the skin every 12 (twelve) hours., Disp: 0 Syringe, Rfl:   No Known Allergies   ROS  As noted in HPI.   Physical Exam  BP 102/67 mmHg  Pulse 85  Temp(Src) 98.6 F (37 C) (Oral)  Resp 18  Ht  (1.727 m)  Wt 194 lb (87.998 kg)  BMI 29.50 kg/m2  SpO2 100%  LMP 03/26/2015  Constitutional: Well developed, well nourished, no acute distress Eyes:  EOMI, conjunctiva normal bilaterally HENT:  Normocephalic, atraumatic,mucus membranes moist Respiratory: Normal inspiratory effort Cardiovascular: Normal rate GI: nondistended. Suprapubic, left flank tenderness. No left lower quadrant tenderness, right lower quadrant tenderness, negative Murphy.  GU: Deferred. Back: Mild left CVAT. skin: No rash, skin intact Musculoskeletal: no deformities Neurologic: Alert & oriented x 3, no focal neuro deficits Psychiatric: Speech and behavior appropriate   ED Course   Medications - No data to display  Orders Placed This  Encounter  Procedures  . Urine culture    Standing Status: Standing     Number of Occurrences: 1     Standing Expiration Date:     Order Specific Question:  Patient immune status    Answer:  Immunocompromised  . Urinalysis complete, with microscopic    Standing Status: Standing     Number of Occurrences: 1     Standing Expiration Date:   . Pregnancy, urine    Standing Status: Standing     Number of Occurrences: 1     Standing Expiration Date:     No results found. Results for orders placed or performed during the hospital encounter of 04/09/15  Urinalysis complete, with microscopic  Result Value Ref Range   Color, Urine YELLOW YELLOW   APPearance CLOUDY (A) CLEAR   Glucose, UA NEGATIVE NEGATIVE mg/dL   Bilirubin Urine NEGATIVE NEGATIVE   Ketones, ur 1+ (A) NEGATIVE mg/dL   Specific Gravity, Urine 1.015 1.005 - 1.030   Hgb urine dipstick 2+ (A) NEGATIVE   pH 5.5 5.0 - 8.0   Protein, ur 100 (A) NEGATIVE mg/dL   Nitrite POSITIVE (A) NEGATIVE   Leukocytes, UA 3+ (A) NEGATIVE   RBC / HPF TOO NUMEROUS TO COUNT 0 - 5 RBC/hpf   WBC, UA TOO NUMEROUS TO COUNT 0 - 5 WBC/hpf   Bacteria, UA MANY (A) NONE SEEN   Squamous Epithelial / LPF 0-5 (A) NONE SEEN  Pregnancy, urine  Result Value Ref Range   Preg Test, Ur NEGATIVE NEGATIVE   ED Clinical Impression  UTI (lower urinary tract infection)   ED Assessment/Plan  Previous records reviewed. No available urine culture for comparison.    patient is not pregnant. Sending urine off for culture to confirm antibiotic choice. No evidence of other intra-abdominal process or GU process causing her symptoms. Presentation consistent with UTI. We'll send home with Pyridium, increase fluids, Tylenol, ibuprofen as needed, Bactrim for 3 days,. Follow up with PMD prn.   Discussed labs,  MDM, plan and followup with patient  Discussed sn/sx that should prompt return to the  ED. Patient  agrees with plan.  *This clinic note was created using  Dragon dictation software. Therefore, there may be occasional mistakes despite careful proofreading.  ?   Domenick GongAshley Laraya Pestka, MD 04/09/15 [phone]

## 2015-04-09 NOTE — ED Notes (Signed)
Patient started having lower back and left side pain 2 days ago with chills. Last UTI was several years ago.

## 2015-04-11 LAB — URINE CULTURE: Culture: >=100000

## 2016-02-27 ENCOUNTER — Other Ambulatory Visit: Payer: Self-pay | Admitting: Family Medicine

## 2016-02-27 DIAGNOSIS — Z1231 Encounter for screening mammogram for malignant neoplasm of breast: Secondary | ICD-10-CM

## 2016-03-10 ENCOUNTER — Ambulatory Visit
Admission: RE | Admit: 2016-03-10 | Discharge: 2016-03-10 | Disposition: A | Discharge status: Home / Self Care | Payer: 59 | Source: Ambulatory Visit | Attending: Family Medicine | Admitting: Family Medicine

## 2016-03-10 DIAGNOSIS — Z1231 Encounter for screening mammogram for malignant neoplasm of breast: Secondary | ICD-10-CM

## 2016-03-13 ENCOUNTER — Other Ambulatory Visit: Payer: Self-pay | Admitting: Family Medicine

## 2016-03-13 DIAGNOSIS — N6489 Other specified disorders of breast: Secondary | ICD-10-CM

## 2016-03-13 DIAGNOSIS — R928 Other abnormal and inconclusive findings on diagnostic imaging of breast: Secondary | ICD-10-CM

## 2016-03-13 DIAGNOSIS — N6310 Unspecified lump in the right breast, unspecified quadrant: Secondary | ICD-10-CM

## 2016-04-01 ENCOUNTER — Ambulatory Visit
Admission: RE | Admit: 2016-04-01 | Discharge: 2016-04-01 | Disposition: A | Discharge status: Home / Self Care | Payer: 59 | Source: Ambulatory Visit | Attending: Family Medicine | Admitting: Family Medicine

## 2016-04-01 DIAGNOSIS — R928 Other abnormal and inconclusive findings on diagnostic imaging of breast: Secondary | ICD-10-CM

## 2016-04-01 DIAGNOSIS — N6489 Other specified disorders of breast: Secondary | ICD-10-CM

## 2016-04-01 DIAGNOSIS — N6310 Unspecified lump in the right breast, unspecified quadrant: Secondary | ICD-10-CM | POA: Diagnosis present

## 2016-04-01 DIAGNOSIS — N6311 Unspecified lump in the right breast, upper outer quadrant: Secondary | ICD-10-CM | POA: Diagnosis not present

## 2016-05-29 DIAGNOSIS — E119 Type 2 diabetes mellitus without complications: Secondary | ICD-10-CM | POA: Insufficient documentation

## 2016-05-29 DIAGNOSIS — Z3009 Encounter for other general counseling and advice on contraception: Secondary | ICD-10-CM | POA: Insufficient documentation

## 2016-05-29 DIAGNOSIS — D573 Sickle-cell trait: Secondary | ICD-10-CM | POA: Insufficient documentation

## 2016-05-29 DIAGNOSIS — I10 Essential (primary) hypertension: Secondary | ICD-10-CM | POA: Insufficient documentation

## 2016-05-29 DIAGNOSIS — O021 Missed abortion: Secondary | ICD-10-CM | POA: Insufficient documentation

## 2017-05-07 ENCOUNTER — Other Ambulatory Visit: Payer: Self-pay | Admitting: Family Medicine

## 2017-05-07 DIAGNOSIS — Z1231 Encounter for screening mammogram for malignant neoplasm of breast: Secondary | ICD-10-CM

## 2019-04-15 ENCOUNTER — Ambulatory Visit: Payer: Self-pay | Attending: Internal Medicine

## 2019-04-15 DIAGNOSIS — Z23 Encounter for immunization: Secondary | ICD-10-CM

## 2019-04-15 NOTE — Progress Notes (Signed)
   Covid-19 Vaccination Clinic  Name:  Karen Charles    MRN: [id number] DOB: Nov 16, 1970  04/15/2019  Karen Charles was observed post Covid-19 immunization for 15 minutes without incident. She was provided with Vaccine Information Sheet and instruction to access the V-Safe system.   Karen Charles was instructed to call 911 with any severe reactions post vaccine: Marland Kitchen Difficulty breathing  . Swelling of face and throat  . A fast heartbeat  . A bad rash all over body  . Dizziness and weakness   Immunizations Administered    Name Date Dose VIS Date Route   Pfizer COVID-19 Vaccine 04/15/2019 11:57 AM 0.3 mL 12/31/2018 Intramuscular   Manufacturer: [employer], [employer]   Lot: [id number]   NDC: [address]-3

## 2019-05-02 ENCOUNTER — Ambulatory Visit
Admission: RE | Admit: 2019-05-02 | Discharge: 2019-05-02 | Disposition: A | Discharge status: Home / Self Care | Payer: BC Managed Care – PPO | Source: Ambulatory Visit | Attending: Family Medicine | Admitting: Family Medicine

## 2019-05-02 ENCOUNTER — Other Ambulatory Visit: Payer: Self-pay

## 2019-05-02 ENCOUNTER — Other Ambulatory Visit: Payer: Self-pay | Admitting: Family Medicine

## 2019-05-02 ENCOUNTER — Ambulatory Visit: Payer: BLUE CROSS/BLUE SHIELD

## 2019-05-02 DIAGNOSIS — M25472 Effusion, left ankle: Secondary | ICD-10-CM

## 2019-05-02 DIAGNOSIS — M25572 Pain in left ankle and joints of left foot: Secondary | ICD-10-CM | POA: Diagnosis not present

## 2019-05-02 DIAGNOSIS — Z86718 Personal history of other venous thrombosis and embolism: Secondary | ICD-10-CM | POA: Insufficient documentation

## 2019-05-09 ENCOUNTER — Ambulatory Visit: Payer: BC Managed Care – PPO | Attending: Internal Medicine

## 2019-05-09 DIAGNOSIS — Z23 Encounter for immunization: Secondary | ICD-10-CM

## 2019-05-09 NOTE — Progress Notes (Signed)
   Covid-19 Vaccination Clinic  Name:  Karen Charles    MRN: [id number] DOB: 09-01-1970  05/09/2019  [name] was observed post Covid-19 immunization for 15 minutes without incident. She was provided with Vaccine Information Sheet and instruction to access the V-Safe system.   [name] was instructed to call 911 with any severe reactions post vaccine: Marland Kitchen Difficulty breathing  . Swelling of face and throat  . A fast heartbeat  . A bad rash all over body  . Dizziness and weakness   Immunizations Administered    Name Date Dose VIS Date Route   Pfizer COVID-19 Vaccine 05/09/2019  4:10 PM 0.3 mL 03/16/2018 Intramuscular   Manufacturer: [employer], [employer]   Lot: [id number]   NDC: [address]-6

## 2019-10-30 ENCOUNTER — Ambulatory Visit (INDEPENDENT_AMBULATORY_CARE_PROVIDER_SITE_OTHER): Payer: BC Managed Care – PPO

## 2019-10-30 ENCOUNTER — Other Ambulatory Visit: Payer: Self-pay

## 2019-10-30 ENCOUNTER — Ambulatory Visit
Admission: EM | Admit: 2019-10-30 | Discharge: 2019-10-30 | Disposition: A | Discharge status: Home / Self Care | Payer: BC Managed Care – PPO | Attending: Emergency Medicine | Admitting: Emergency Medicine

## 2019-10-30 DIAGNOSIS — I82A12 Acute embolism and thrombosis of left axillary vein: Secondary | ICD-10-CM

## 2019-10-30 DIAGNOSIS — M25511 Pain in right shoulder: Secondary | ICD-10-CM

## 2019-10-30 DIAGNOSIS — R079 Chest pain, unspecified: Secondary | ICD-10-CM | POA: Diagnosis not present

## 2019-10-30 LAB — FIBRIN DERIVATIVES D-DIMER (ARMC ONLY): Fibrin derivatives D-dimer (ARMC): 1074.7 ng/mL (FEU) — ABNORMAL HIGH (ref 0.00–499.00)

## 2019-10-30 LAB — CBC WITH DIFFERENTIAL/PLATELET
Abs Immature Granulocytes: 0.01 10*3/uL (ref 0.00–0.07)
Basophils Absolute: 0 10*3/uL (ref 0.0–0.1)
Basophils Relative: 1 %
Eosinophils Absolute: 0.1 10*3/uL (ref 0.0–0.5)
Eosinophils Relative: 3 %
HCT: 44.4 % (ref 36.0–46.0)
Hemoglobin: 15.2 g/dL — ABNORMAL HIGH (ref 12.0–15.0)
Immature Granulocytes: 0 %
Lymphocytes Relative: 38 %
Lymphs Abs: 1.9 10*3/uL (ref 0.7–4.0)
MCH: 29 pg (ref 26.0–34.0)
MCHC: 34.2 g/dL (ref 30.0–36.0)
MCV: 84.6 fL (ref 80.0–100.0)
Monocytes Absolute: 0.4 10*3/uL (ref 0.1–1.0)
Monocytes Relative: 8 %
Neutro Abs: 2.6 10*3/uL (ref 1.7–7.7)
Neutrophils Relative %: 50 %
Platelets: 276 10*3/uL (ref 150–400)
RBC: 5.25 MIL/uL — ABNORMAL HIGH (ref 3.87–5.11)
RDW: 13.2 % (ref 11.5–15.5)
WBC: 5.1 10*3/uL (ref 4.0–10.5)
nRBC: 0 % (ref 0.0–0.2)

## 2019-10-30 LAB — COMPREHENSIVE METABOLIC PANEL
ALT: 20 U/L (ref 0–44)
AST: 23 U/L (ref 15–41)
Albumin: 4.3 g/dL (ref 3.5–5.0)
Alkaline Phosphatase: 54 U/L (ref 38–126)
Anion gap: 10 (ref 5–15)
BUN: 8 mg/dL (ref 6–20)
CO2: 23 mmol/L (ref 22–32)
Calcium: 9.3 mg/dL (ref 8.9–10.3)
Chloride: 107 mmol/L (ref 98–111)
Creatinine, Ser: 1.11 mg/dL — ABNORMAL HIGH (ref 0.44–1.00)
GFR, Estimated: 60 mL/min — ABNORMAL LOW (ref >60)
Glucose, Bld: 85 mg/dL (ref 70–99)
Potassium: 4.8 mmol/L (ref 3.5–5.1)
Sodium: 140 mmol/L (ref 135–145)
Total Bilirubin: 1.6 mg/dL — ABNORMAL HIGH (ref 0.3–1.2)
Total Protein: 8.3 g/dL — ABNORMAL HIGH (ref 6.5–8.1)

## 2019-10-30 MED ORDER — RIVAROXABAN (XARELTO) VTE STARTER PACK (15 & 20 MG): 15.0000 mg | ORAL_TABLET | Freq: Two times a day (BID) | ORAL | 0 refills | Status: DC

## 2019-10-30 MED ORDER — IOHEXOL 350 MG/ML SOLN
80.0000 mL | Freq: Once | INTRAVENOUS | Status: AC | PRN
  Administered 2019-10-30: 80 mL via INTRAVENOUS

## 2019-10-30 NOTE — ED Provider Notes (Signed)
[facility]    CSN: [id number] Arrival date & time: 10/30/19  0843      History   Chief Complaint Chief Complaint  Patient presents with   Arm Pain    left    HPI Karen Charles is a 49 y.o. female.   49 yo female here for evaluation of a "toothache-like" pain in her left shoulder that is a similar presentation to a DVT she had in January in the same arm. She was seen by her PCP and treated with baby ASA only that she started herself. Patient has long history of clots and bleeding associated with hormonal birth control. 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 She has had a tubal ligation and is not taking hormonal BC now. She had a bilateral LE venous Doppler in April of this year which did not show DVT. She has not had any calf pain or leg swelling. She also has not had any upper arm swelling. She typically does not have pain with he DVT's and PE's. She denies CP or SOB.      Past Medical History:  Diagnosis Date   Asthma    Diabetes mellitus without complication (HCC)    GERD (gastroesophageal reflux disease)    Headache    Hx of blood clots    Presence of IVC filter    inserted 07/12/2014    Patient Active Problem List   Diagnosis Date Noted   Bariatric surgery status 07/18/2014    Past Surgical History:  Procedure Laterality Date   CHOLECYSTECTOMY     DILATION AND CURETTAGE OF UTERUS     LAPAROSCOPIC GASTRIC RESTRICTIVE DUODENAL PROCEDURE (DUODENAL SWITCH) N/A 07/18/2014   Procedure: LAPAROSCOPIC GASTRIC RESTRICTIVE DUODENAL PROCEDURE (DUODENAL SWITCH);  Surgeon: Everette Rank, MD;  Location: [facility] ORS;  Service: General;  Laterality: N/A;  Duodenal switch with repair biliary pancreatic diversion and repair of hiatal hernia   TONSILLECTOMY      OB History   No obstetric history on file.      Home Medications    Prior to Admission medications     Medication Sig Start Date End Date Taking? Authorizing Provider  albuterol (PROVENTIL HFA;VENTOLIN HFA) 108 (90 BASE) MCG/ACT inhaler Inhale 1-2 puffs into the lungs every 6 (six) hours as needed for wheezing or shortness of breath.   Yes [provider]  aspirin 81 MG chewable tablet Chew by mouth daily.   Yes [provider]  cetirizine (ZYRTEC) 10 MG tablet Take 10 mg by mouth daily as needed for allergies.   Yes [provider]  cyanocobalamin 1000 MCG tablet Take by mouth.   Yes [provider]  ergocalciferol (VITAMIN D2) 1.25 MG (50000 UT) capsule Take by mouth.   Yes [provider]  Multiple Vitamin (MULTIVITAMIN) tablet Take 1 tablet by mouth daily.   Yes [provider]  pantoprazole (PROTONIX) 20 MG tablet Take 20 mg by mouth daily. 07/04/19  Yes [provider]  RIVAROXABAN Carlena Hurl) VTE STARTER PACK (15 & 20 MG TABLETS) Take 15 mg by mouth 2 (two) times daily. Follow package directions: Take one 15mg  tablet by mouth twice a day. On day 22, switch to one 20mg  tablet once a day. Take with food. 10/30/19   , NP  enoxaparin (LOVENOX) 40 MG/0.4ML injection Inject 0.4 mLs (40 mg total) into the skin every 12 (twelve)  hours. 07/20/14 04/09/15  Everette Rank, MD    Family History Family History  Problem Relation Age of Onset   Lupus Mother    Diabetes Father    Congestive Heart Failure Father    Hypertension Father    Breast cancer Neg Hx     Social History Social History   Tobacco Use   Smoking status: Never Smoker   Smokeless tobacco: Never Used  Vaping Use   Vaping Use: Never used  Substance Use Topics   Alcohol use: No   Drug use: No     Allergies   Patient has no known allergies.   Review of Systems Review of Systems  Constitutional: Negative for activity change, fatigue and fever.  HENT: Negative for congestion, rhinorrhea and sore throat.   Respiratory: Negative for cough,  shortness of breath and wheezing.   Cardiovascular: Negative for chest pain, palpitations and leg swelling.  Musculoskeletal: Positive for arthralgias. Negative for joint swelling and myalgias.  Skin: Negative.   Neurological: Positive for numbness. Negative for dizziness and syncope.       Numbness in her left arm and all of her fingers when she lays down. Resolves when up and moving.   Hematological: Negative.   Psychiatric/Behavioral: Negative.      Physical Exam Triage Vital Signs ED Triage Vitals  Enc Vitals Group     BP 10/30/19 0901 107/76     Pulse Rate 10/30/19 0901 (!) 58     Resp 10/30/19 0901 17     Temp 10/30/19 0901 98.6 F (37 C)     Temp Source 10/30/19 0901 Oral     SpO2 10/30/19 0901 98 %     Weight 10/30/19 0857 230 lb (104.3 kg)     Height 10/30/19 0857 5\' 8"  (1.727 m)     Head Circumference --      Peak Flow --      Pain Score 10/30/19 0857 2     Pain Loc --      Pain Edu? --      Excl. in GC? --    No data found.  Updated Vital Signs BP 107/76 (BP Location: Right Arm)    Pulse (!) 58    Temp 98.6 F (37 C) (Oral)    Resp 17    Ht 5\' 8"  (1.727 m)    Wt 230 lb (104.3 kg)    LMP 10/06/2019    SpO2 98%    BMI 34.97 kg/m   Visual Acuity Right Eye Distance:   Left Eye Distance:   Bilateral Distance:    Right Eye Near:   Left Eye Near:    Bilateral Near:     Physical Exam Vitals and nursing note reviewed.  Constitutional:      General: She is not in acute distress.    Appearance: Normal appearance.  HENT:     Head: Normocephalic and atraumatic.  Eyes:     General: No scleral icterus.    Extraocular Movements: Extraocular movements intact.     Conjunctiva/sclera: Conjunctivae normal.     Pupils: Pupils are equal, round, and reactive to light.  Cardiovascular:     Rate and Rhythm: Normal rate and regular rhythm.     Pulses: Normal pulses.     Heart sounds: Normal heart sounds. No murmur heard.  No gallop.   Pulmonary:     Effort: Pulmonary  effort is normal. No respiratory distress.     Breath sounds: No wheezing, rhonchi or rales.  Musculoskeletal:        General: No swelling or tenderness.     Cervical back: Normal range of motion and neck supple. No tenderness.     Comments: Bilateral bicep circumference is 37 cm. There is no edema, ecchymosis, or erythema. No palpable cords. Radial and ulnar pulses are 2+. Shoulder pain cannot be elicited with palpation or movement.   Lymphadenopathy:     Cervical: No cervical adenopathy.  Skin:    General: Skin is warm and dry.     Capillary Refill: Capillary refill takes less than 2 seconds.     Findings: No erythema.  Neurological:     General: No focal deficit present.     Mental Status: She is alert and oriented to person, place, and time.  Psychiatric:        Mood and Affect: Mood normal.        Behavior: Behavior normal.        Thought Content: Thought content normal.        Judgment: Judgment normal.      UC Treatments / Results  Labs (all labs ordered are listed, but only abnormal results are displayed) Labs Reviewed  COMPREHENSIVE METABOLIC PANEL - Abnormal; Notable for the following components:      Result Value   Creatinine, Ser 1.11 (*)    Total Protein 8.3 (*)    Total Bilirubin 1.6 (*)    GFR, Estimated 60 (*)    All other components within normal limits  CBC WITH DIFFERENTIAL/PLATELET - Abnormal; Notable for the following components:   RBC 5.25 (*)    Hemoglobin 15.2 (*)    All other components within normal limits  FIBRIN DERIVATIVES D-DIMER ([facility] ONLY) - Abnormal; Notable for the following components:   Fibrin derivatives D-dimer [facility]) 1,074.70 (*)    All other components within normal limits    EKG   Radiology CT Angio Chest PE W and/or Wo Contrast  Result Date: 10/30/2019 CLINICAL DATA:  Right shoulder and chest pain, high prob PE. History of PE and DVT. EXAM: CT ANGIOGRAPHY CHEST WITH CONTRAST TECHNIQUE: Multidetector CT imaging of the chest  was performed using the standard protocol during bolus administration of intravenous contrast. Multiplanar CT image reconstructions and MIPs were obtained to evaluate the vascular anatomy. CONTRAST:  32mL OMNIPAQUE IOHEXOL 350 MG/ML SOLN COMPARISON:  05/26/2011 FINDINGS: Cardiovascular: Heart size normal. No pericardial effusion. The RV is nondilated. Satisfactory opacification of pulmonary arteries noted, and there is no evidence of pulmonary emboli. Adequate contrast opacification of the thoracic aorta with no evidence of dissection, aneurysm, or stenosis. There is classic 3-vessel brachiocephalic arch anatomy without proximal stenosis. No significant atheromatous change. Mediastinum/Nodes: No mass or adenopathy. Lungs/Pleura: No pleural effusion no pneumothorax. Small bleb in the right lower lobe. Lungs are otherwise clear. Upper Abdomen: Changes of interval gastric surgery. No acute findings. Musculoskeletal: No chest wall abnormality. No acute or significant osseous findings. Review of the MIP images confirms the above findings. IMPRESSION: Negative for acute PE or thoracic aortic dissection. Electronically Signed   By: Corlis Leak M.D.   On: 10/30/2019 11:05    Procedures Procedures (including critical care time)  Medications Ordered in UC Medications  iohexol (OMNIPAQUE) 350 MG/ML injection 80 mL (80 mLs Intravenous Contrast Given 10/30/19 1033)    Initial Impression / Assessment and Plan / UC Course  I have reviewed the triage vital signs and the nursing notes.  Pertinent labs & imaging results that were available during my care of  the patient were reviewed by me and considered in my medical decision making (see chart for details).  Clinical Course as of Oct 30 1143  Sun Oct 30, 2019  1129 1.11  ALT: 20 [JR]    Clinical Course User Index [JR] Becky Augustayan, Steffie Waggoner, NP  Patient is here for evaluation of pain in her left arm/shoulder that started 4 days ago. This presentation is similar to when  she had a blood clot in that same arm 8-9 months ago. She ahs a long history of cerebral bleeds, PE's, and DVT's associated with BC. [facility]. She is unsure if she has ever been evaluated by Hematology. She took Lovenox in 2010 when she had the PE's and double DVT in her right leg but is not on anticoagulant therapy now.  Due to presentation and history will order CTA chest, CBC, CMP, and D-Dimer to look for thrombus. If positive will start anticoagulant therapy and refer to hematology.   D-Dimer 1074.70, CT chest negative for PE- no comment by radiologist on peripheral vessels. Positive D-Dimer and history of PE and DVT makes clot likely.   CMP serum creatinine 1.11, calculated creatinine clearance is 104.27.  Will start on Rivaroxaban 15 mg Q12 hours x 21 days and then 20 mg daily and refer to Hematology.  Final Clinical Impressions(s) / UC Diagnoses   Final diagnoses:  Acute deep vein thrombosis (DVT) of axillary vein of left upper extremity [facility](HCC)     Discharge Instructions     Take the Xarelto as directed. You will take 15 mg twice daily for 21 days and then switch to 20 mg daily.  Stop your Aspirin.  I have referred you to Hematology for further work-up and evaluation of continued clotting.     ED Prescriptions    Medication Sig Dispense Auth. Provider   RIVAROXABAN Carlena Hurl(XARELTO) VTE STARTER PACK (15 & 20 MG TABLETS) Take 15 mg by mouth 2 (two) times daily. Follow package directions: Take one 15mg  tablet by mouth twice a day. On day 22, switch to one 20mg  tablet once a day. Take with food. 51 each Becky Augustayan, Elick Aguilera, NP     PDMP not reviewed this encounter.   Becky Augustayan, Emberlee Sortino, NP 10/30/19 1145

## 2019-10-30 NOTE — ED Triage Notes (Signed)
Patient states that starting on Wednesday she started having a tooth-ache like pain in left arm, reports that she is she has noticed a tingling like sensation in her arm. Reports that she has a history of blood clots with similar symptoms. Reports that her last blood clot was in this arm earlier in the year.

## 2019-10-30 NOTE — Discharge Instructions (Addendum)
Take the Xarelto as directed. You will take 15 mg twice daily for 21 days and then switch to 20 mg daily.  Stop your Aspirin.  I have referred you to Hematology for further work-up and evaluation of continued clotting.

## 2019-11-02 ENCOUNTER — Inpatient Hospital Stay: Payer: BC Managed Care – PPO | Admitting: Hematology and Oncology

## 2019-11-02 ENCOUNTER — Inpatient Hospital Stay: Payer: BC Managed Care – PPO

## 2019-11-02 DIAGNOSIS — Z86711 Personal history of pulmonary embolism: Secondary | ICD-10-CM | POA: Insufficient documentation

## 2019-11-02 DIAGNOSIS — Z86718 Personal history of other venous thrombosis and embolism: Secondary | ICD-10-CM | POA: Insufficient documentation

## 2019-11-03 NOTE — Progress Notes (Signed)
[facility] [place] [facility]  [address], [address] [place], [place] [address] Phone: [phone]  Fax: [phone]   Clinic Day:  11/07/2019  Referring physician: Dortha KernBliss, Karen K, MD  Chief Complaint: Karen GlassingChristie D Charles is a 49 y.o. 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   HPI: The patient had a worsening headache in 1998 and went to the ER. They "found out that it was a clot".  She was on DepoProvera at the time. She does not think it was a stroke. She was [facility]. She was discharged on a blood thinner, but is not sure what type. She continued it for a year.  She describes being on Lovenox in 2010 while she was pregnant with her son.  She describes acute shortness of breath in 2012; she had a day when she had to keep using her inhaler because she couldn't breathe. She was seen in Urgent Care. Chest CT on 09/04/2010 revealed bilateral pulmonary emboli.  There was thrombus present within the left main pulmonary artery, and segmental as well as subsegmental branches of the right lower lobe, left lower lobe, right upper lobe and left upper lobe pulmonary arteries c/w pulmonary emboli. The main pulmonary artery, right main pulmonary artery, and left main pulmonary arteries were normal in size.  Bilateral lower extremity venous duplex on 09/05/2010 revealed a nonocclusive deep vein thrombosis involving the upper portion of the superficial femoral on the right. There was no deep vein thrombosis seen on the left. She had a Mirena IUD at the time; it was taken out. This was the last time she used hormonal birth control. She believes that this is when she started taking Coumadin.  She took Coumadin for less than 1 year.  The patient underwent bilateral salpingectomy on 08/04/2017.  In 01/2019, she woke up with severe left arm pain and swelling. She did not  seek treatment. Three days later, she starting taking aspirin daily. The next day, her arm felt better.   Left lower extremity venous ultrasound on 05/02/2019 revealed no evidence of acute or chronic DVT.  The patient presented to Urgent Care on 10/30/2019 with left shoulder pain and swelling x 4 days. Her hand was not swollen. This was a similar presentation to the possible clot she had in 01/2019. She denied calf pain or leg swelling. She was not on any anticoagulants. Hematocrit was 44.4, hemoglobin 15.2, MCV 84.6, platelets 276,000, WBC 5100 with an ANC 2600.  Creatinine was 1.11.  Protein was 8.3 and albumen 4.3.  D-dimer was 1,074.70.  Chest CT angiogram was negative for acute PE or thoracic aortic dissection.  She was started on Xarelto 15 mg BID x 21 days then 20 mg daily. She was referred to hematology.   She underwent duodenal switch on 07/18/2014. She drinks takes supplements:  apple cider, vitamin D3, vitamin B12 5,000 mcg, and oral iron once daily.   Symptomatically, she feels "alright".  She reports leg and arm aches if she lays down for too long.  Pain causes her to not sleep well. Her left arm tingles when she lays down. She has hip pain. Her arms bruise easily. She denies any trauma or injury to her left arm.  The patient denies fevers, sweats, headaches, changes in vision, runny nose, sore throat, cough, shortness of breath, chest pain, palpitations, nausea, vomiting, diarrhea, reflux, urinary symptoms, skin changes, numbness,  weakness, balance or coordination problems, and bleeding of any kind except for periods.   She has never undergone testing by vascular surgery.  She denies a family history of blood disorders. Her maternal uncle died of cancer. His maternal aunt had throat cancer. Her mother had lupus.   Past Medical History:  Diagnosis Date  . Asthma   . Diabetes mellitus without complication (HCC)   . GERD (gastroesophageal reflux disease)   . Headache   . Hx of blood  clots   . Presence of IVC filter    inserted 07/12/2014    Past Surgical History:  Procedure Laterality Date  . CHOLECYSTECTOMY    . DILATION AND CURETTAGE OF UTERUS    . gallbladder removeral    . 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 TONSILLECTOMY      Family History  Problem Relation Age of Onset  . Lupus Mother   . Diabetes Father   . Congestive Heart Failure Father   . Hypertension Father   . Breast cancer Neg Hx     Social History:  reports that she has never smoked. She has never used smokeless tobacco. She reports that she does not drink alcohol and does not use drugs. The patient denies alcohol, tobacco, and drug use. She works from home for [employer]. She has been working there for 21 years. She denies any exposure to radiation or toxins. She has a son and a daughter. The patient is alone today.  Allergies: No Known Allergies  Current Medications: Current Outpatient Medications  Medication Sig Dispense Refill  . albuterol (PROVENTIL HFA;VENTOLIN HFA) 108 (90 BASE) MCG/ACT inhaler Inhale 1-2 puffs into the lungs every 6 (six) hours as needed for wheezing or shortness of breath.    Marland Kitchen aspirin 81 MG chewable tablet Chew by mouth daily.    . cetirizine (ZYRTEC) 10 MG tablet Take 10 mg by mouth daily as needed for allergies.    . Cholecalciferol (VITAMIN D3) 1.25 MG (50000 UT) TABS Take 50,000 Units by mouth once a week.    . cyanocobalamin 1000 MCG tablet Take by mouth.    . Multiple Vitamin (MULTIVITAMIN) tablet Take 1 tablet by mouth daily.    . pantoprazole (PROTONIX) 20 MG tablet Take 20 mg by mouth daily.    Marland Kitchen RIVAROXABAN (XARELTO) VTE STARTER PACK (15 & 20 MG TABLETS)  Take 15 mg by mouth 2 (two) times daily. Follow package directions: Take one 15mg  tablet by mouth twice a day. On day 22, switch to one 20mg  tablet once a day. Take with food. 51 each 0  . ergocalciferol (VITAMIN D2) 1.25 MG (50000 UT) capsule Take by mouth.     No current facility-administered medications for this visit.    Review of Systems  Constitutional: Negative for chills, diaphoresis, fever, malaise/fatigue and weight loss.       Feels "alright".  HENT: Negative for congestion, ear discharge, ear pain, hearing loss, nosebleeds, sinus pain, sore throat and tinnitus.   Eyes: Negative for blurred vision.  Respiratory: Negative for cough, hemoptysis, sputum production and shortness of breath.   Cardiovascular: Negative for chest pain, palpitations and leg swelling.  Gastrointestinal: Negative for abdominal pain, blood in stool, constipation, diarrhea, heartburn, melena, nausea and vomiting.  Genitourinary: Negative for dysuria, frequency, hematuria and urgency.  Musculoskeletal: Positive for joint pain (hips) and myalgias (arm and leg aches). Negative for back pain and neck pain.  Skin: Negative for itching and rash.  Neurological: Positive for tingling (left arm, when she lays down). Negative for dizziness, sensory change, weakness and headaches.  Endo/Heme/Allergies: Bruises/bleeds easily (bruising on Xarelto).  Psychiatric/Behavioral: Negative for depression and memory loss. The patient has insomnia (due to muscle aches). The patient is not nervous/anxious.   All other systems reviewed and are negative.  Performance status (ECOG): 1  Vitals Blood pressure 119/79, pulse 96, temperature 98.1 F (36.7 C), temperature source Tympanic, weight 235 lb 5.5 oz (106.8 kg), SpO2 100 %.   Physical Exam Vitals and nursing note reviewed.  Constitutional:      General: She is not in acute distress.    Appearance: She is not diaphoretic.  HENT:     Head: Normocephalic and atraumatic.      Comments: Dark styled hair.    Mouth/Throat:     Mouth: Mucous membranes are moist.     Pharynx: Oropharynx is clear.  Eyes:     General: No scleral icterus.    Extraocular Movements: Extraocular movements intact.     Conjunctiva/sclera: Conjunctivae normal.     Pupils: Pupils are equal, round, and reactive to light.     Comments: Brown eyes.  Cardiovascular:     Rate and Rhythm: Normal rate and regular rhythm.     Heart sounds: Normal heart sounds. No murmur heard.   Pulmonary:     Effort: Pulmonary effort is normal. No respiratory distress.     Breath sounds: Normal breath sounds. No wheezing or rales.  Chest:     Chest wall: No tenderness.  Abdominal:     General: Bowel sounds are normal. There is no distension.     Palpations: Abdomen is soft. There is no mass.     Tenderness: There is no abdominal tenderness. There is no guarding or rebound.  Musculoskeletal:        General: Tenderness present. No swelling. Normal range of motion.     Cervical back: Normal range of motion and neck supple.     Comments: Right calf tenderness. No tenderness with dorsiflexion. Left lateral upper arm/shoulder tenderness.  No swelling.  Lymphadenopathy:     Head:     Right side of head: No preauricular, posterior auricular or occipital adenopathy.     Left side of head: No preauricular, posterior auricular or occipital adenopathy.     Cervical: No cervical adenopathy.     Upper Body:     Right upper body: No supraclavicular or axillary adenopathy.     Left upper body: No supraclavicular or axillary adenopathy.     Lower Body: No right inguinal adenopathy. No left inguinal adenopathy.  Skin:    General: Skin is warm and dry.  Neurological:     Mental Status: She is alert and oriented to person, place, and time.  Psychiatric:        Behavior: Behavior normal.        Thought Content: Thought content normal.        Judgment: Judgment normal.    No visits with results within 3 Day(s) from  this visit.  Latest known visit with results is:  Admission on 10/30/2019, Discharged on 10/30/2019  Component Date Value Ref Range Status  . Sodium 10/30/2019 140  135 - 145 mmol/L Final  . Potassium 10/30/2019 4.8  3.5 -  5.1 mmol/L Final  . Chloride 10/30/2019 107  98 - 111 mmol/L Final  . CO2 10/30/2019 23  22 - 32 mmol/L Final  . Glucose, Bld 10/30/2019 85  70 - 99 mg/dL Final   Glucose reference range applies only to samples taken after fasting for at least 8 hours.  . BUN 10/30/2019 8  6 - 20 mg/dL Final  . Creatinine, Ser 10/30/2019 1.11* 0.44 - 1.00 mg/dL Final  . Calcium 65/78/4696 9.3  8.9 - 10.3 mg/dL Final  . Total Protein 10/30/2019 8.3* 6.5 - 8.1 g/dL Final  . Albumin 29/52/8413 4.3  3.5 - 5.0 g/dL Final  . AST 24/40/1027 23  15 - 41 U/L Final  . ALT 10/30/2019 20  0 - 44 U/L Final  . Alkaline Phosphatase 10/30/2019 54  38 - 126 U/L Final  . Total Bilirubin 10/30/2019 1.6* 0.3 - 1.2 mg/dL Final  . GFR, Estimated 10/30/2019 60* >60 mL/min Final  . Anion gap 10/30/2019 10  5 - 15 Final   Performed at [facility], [address], [place], [place] [address]  . WBC 10/30/2019 5.1  4.0 - 10.5 K/uL Final  . RBC 10/30/2019 5.25* 3.87 - 5.11 MIL/uL Final  . Hemoglobin 10/30/2019 15.2* 12.0 - 15.0 g/dL Final  . HCT 44/03/4740 44.4  36 - 46 % Final  . MCV 10/30/2019 84.6  80.0 - 100.0 fL Final  . MCH 10/30/2019 29.0  26.0 - 34.0 pg Final  . MCHC 10/30/2019 34.2  30.0 - 36.0 g/dL Final  . RDW 59/56/3875 13.2  11.5 - 15.5 % Final  . Platelets 10/30/2019 276  150 - 400 K/uL Final  . nRBC 10/30/2019 0.0  0.0 - 0.2 % Final  . Neutrophils Relative % 10/30/2019 50  % Final  . Neutro Abs 10/30/2019 2.6  1.7 - 7.7 K/uL Final  . Lymphocytes Relative 10/30/2019 38  % Final  . Lymphs Abs 10/30/2019 1.9  0.7 - 4.0 K/uL Final  . Monocytes Relative 10/30/2019 8  % Final  . Monocytes Absolute 10/30/2019 0.4  0.1 - 1.0 K/uL Final  . Eosinophils Relative 10/30/2019 3  % Final  .  Eosinophils Absolute 10/30/2019 0.1  0.0 - 0.5 K/uL Final  . Basophils Relative 10/30/2019 1  % Final  . Basophils Absolute 10/30/2019 0.0  0.0 - 0.1 K/uL Final  . Immature Granulocytes 10/30/2019 0  % Final  . Abs Immature Granulocytes 10/30/2019 0.01  0.00 - 0.07 K/uL Final   Performed at [facility], [address] [place], [place] [address]  . Fibrin derivatives D-dimer ([facility]) 10/30/2019 1,074.70* 0.00 - 499.00 ng/mL (FEU) Final   Comment: (NOTE) <> Exclusion of Venous Thromboembolism (VTE) - OUTPATIENT ONLY   (Emergency Department or [place])    0-499 ng/ml (FEU): With a low to intermediate pretest probability                      for VTE this test result excludes the diagnosis                      of VTE.   >499 ng/ml (FEU) : VTE not excluded; additional work up for VTE is                      required.  <> Testing on Inpatients and Evaluation of Disseminated Intravascular   Coagulation (DIC) Reference Range:   0-499 ng/ml (FEU) Performed at [employer]  Urgent [facility], [address] [place], [place] [address]     Assessment:  Karen Charles is a 49 y.o. female with a history of pulmonary embolism and right lower extremity DVT.  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  Chest CT on 09/04/2010 revealed bilateral pulmonary emboli.  There was thrombus in the left main pulmonary artery, and segmental as well as subsegmental branches of the right lower lobe, left lower lobe, right upper lobe and left upper lobe pulmonary arteries c/w pulmonary emboli. The main pulmonary artery, right main pulmonary artery, and left main pulmonary arteries were normal in size.  Bilateral lower extremity venous duplex on 09/05/2010 revealed a nonocclusive deep vein thrombosis involving the upper portion of the superficial femoral on the right. There was no deep vein thrombosis seen on the left. She had a Mirena IUD at the time. She believes she was treated with Coumadin  < than a year.  She has had unusual left arm and shoulder pain.  She denies any family history of blood disorders.  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  She takes Vitamin D3, vitamin B12 5,000 mcg, and oral iron once daily.   The patient received the Pfizer COVID-19 vaccine on 03/26/20201 and 05/09/2019.  Symptomatically, she feels "alright".  She reports leg and arm aches if she lays down for too long.  She has hip pain. She bruises easily on her arms. She denies any fevers, sweats or weight loss.  Exam reveals no evidence of thrombosis.   Plan: 1.   [facility] labs:  CBC with diff, CMP, direct bilirubin, PT, D-dimers, Factor V Leiden, prothrombin gene mutation, lupus anticoagulant panel, anti-cardiolipin antibodies, beta2-glycoprotein antibodies. 2.   History of PE and DVT  Patient is s/p DVT and PET in 08/2010.   Clot associated with Mirena IUD.  She has had unusual arm pain on 2 separate occasions (01/2019 and 10/30/2019).   With the first episode, symptoms resolved with aspirin.   With the second episode, she was started on Xarelto.    Chect CT angiogram was negative.  Left lower extremity venous ultrasound on 05/02/2019 revealed no evidence of acute or chronic DVT.  Discuss referral to vascular surgery to assess for possible thrombosis or anatomic anomaly resulting in thrombosis.  Discuss risk factors for thrombosis (pregnancy, obesity, malignancy, dehydration, pelvic/lower extremity surgery, immobility, clotting deficiencies).  Discuss analogy of a basket floating on the water.  Risk factors for thrombosis are various sized rocks in the basket.   Enough rocks in the basket and the boat sinks.  Sinking represents thrombosis.   Discuss eliminating risk factors that can be controlled.  Discuss initial hypercoagulable work-up and possible additional testing.  Continue Xarelto for now. 3.   Elevated d-dimers  D-dimer was 1074.70 (0-499) on 10/30/2019.  Chest CT angiogram on  10/30/2019 revealed no evidence of pulmonary embolism.  Referral to vascular medicine to r/o upper extremity thrombosis.  Elevated D-dimers can be secondary to thrombosis (DVT, PE, MI, CVA, DIC), severe infections including COVID-19, liver and kidney disease, trauma, and pregnancy.   No evidence of infection, trauma.   No known kidney disease.  Repeat D-dimers. 4.   History of bariatric surgery  Hematocrit 44.4.  Hemoglobin 15.2.  MCV 84.6 on 10/30/2019.  Patient at risk for iron deficiency anemia and B12 deficiency.  Check ferritin, iron studies, B12. 5.   Consult vascular surgery. 6.   RTC in 1 week for review of work-up and discussion  regarding direction of therapy.  I discussed the assessment and treatment plan with the patient.  The patient was provided an opportunity to ask questions and all were answered.  The patient agreed with the plan and demonstrated an understanding of the instructions.  The patient was advised to call back if the symptoms worsen or if the condition fails to improve as anticipated.  I provided 36 minutes of face-to-face time during this this encounter and > 50% was spent counseling as documented under my assessment and plan.  An additional 15 minutes were spent reviewing her chart (Epic and Care Everywhere) including notes, labs, and imaging studies.    Genice Kimberlin C. Merlene Pulling, MD, PhD    11/07/2019, 2:07 PM  I, Danella Penton Tufford, am acting as Neurosurgeon for [employer]. Merlene Pulling, MD, PhD.  I, Jayln Branscom C. Merlene Pulling, MD, have reviewed the above documentation for accuracy and completeness, and I agree with the above.

## 2019-11-07 ENCOUNTER — Other Ambulatory Visit: Payer: Self-pay

## 2019-11-07 ENCOUNTER — Inpatient Hospital Stay: Payer: BC Managed Care – PPO | Admitting: Hematology and Oncology

## 2019-11-07 ENCOUNTER — Inpatient Hospital Stay: Payer: BC Managed Care – PPO | Attending: Hematology and Oncology

## 2019-11-07 ENCOUNTER — Encounter: Payer: Self-pay | Admitting: Hematology and Oncology

## 2019-11-07 VITALS — BP 119/79 | HR 96 | Temp 98.1°F | Wt 235.3 lb

## 2019-11-07 DIAGNOSIS — Z86711 Personal history of pulmonary embolism: Secondary | ICD-10-CM

## 2019-11-07 DIAGNOSIS — Z7901 Long term (current) use of anticoagulants: Secondary | ICD-10-CM | POA: Diagnosis not present

## 2019-11-07 DIAGNOSIS — R7989 Other specified abnormal findings of blood chemistry: Secondary | ICD-10-CM | POA: Insufficient documentation

## 2019-11-07 DIAGNOSIS — Z9884 Bariatric surgery status: Secondary | ICD-10-CM

## 2019-11-07 DIAGNOSIS — Z86718 Personal history of other venous thrombosis and embolism: Secondary | ICD-10-CM | POA: Diagnosis not present

## 2019-11-07 NOTE — Progress Notes (Signed)
The patient reports constant pain to bilateral lags and arms , Increase pain with lying down ( Pain level 8)

## 2019-11-08 ENCOUNTER — Encounter: Payer: Self-pay | Admitting: Hematology and Oncology

## 2019-11-09 ENCOUNTER — Encounter: Payer: Self-pay | Admitting: Hematology and Oncology

## 2019-11-10 ENCOUNTER — Other Ambulatory Visit (INDEPENDENT_AMBULATORY_CARE_PROVIDER_SITE_OTHER): Payer: Self-pay | Admitting: Vascular Surgery

## 2019-11-10 DIAGNOSIS — Z86711 Personal history of pulmonary embolism: Secondary | ICD-10-CM

## 2019-11-10 DIAGNOSIS — M79602 Pain in left arm: Secondary | ICD-10-CM

## 2019-11-10 DIAGNOSIS — Z86718 Personal history of other venous thrombosis and embolism: Secondary | ICD-10-CM

## 2019-11-11 ENCOUNTER — Encounter (INDEPENDENT_AMBULATORY_CARE_PROVIDER_SITE_OTHER): Payer: Self-pay | Admitting: Vascular Surgery

## 2019-11-11 ENCOUNTER — Ambulatory Visit (INDEPENDENT_AMBULATORY_CARE_PROVIDER_SITE_OTHER): Payer: BC Managed Care – PPO | Admitting: Vascular Surgery

## 2019-11-11 ENCOUNTER — Ambulatory Visit (INDEPENDENT_AMBULATORY_CARE_PROVIDER_SITE_OTHER): Payer: BC Managed Care – PPO

## 2019-11-11 ENCOUNTER — Other Ambulatory Visit: Payer: Self-pay

## 2019-11-11 VITALS — BP 116/77 | HR 67 | Resp 16 | Ht 68.0 in | Wt 233.6 lb

## 2019-11-11 DIAGNOSIS — I10 Essential (primary) hypertension: Secondary | ICD-10-CM

## 2019-11-11 DIAGNOSIS — Z86711 Personal history of pulmonary embolism: Secondary | ICD-10-CM

## 2019-11-11 DIAGNOSIS — M79602 Pain in left arm: Secondary | ICD-10-CM

## 2019-11-11 DIAGNOSIS — Z86718 Personal history of other venous thrombosis and embolism: Secondary | ICD-10-CM

## 2019-11-11 DIAGNOSIS — E119 Type 2 diabetes mellitus without complications: Secondary | ICD-10-CM

## 2019-11-11 DIAGNOSIS — E559 Vitamin D deficiency, unspecified: Secondary | ICD-10-CM | POA: Insufficient documentation

## 2019-11-11 NOTE — Assessment & Plan Note (Signed)
blood pressure control important in reducing the progression of atherosclerotic disease. On appropriate oral medications.  

## 2019-11-11 NOTE — Assessment & Plan Note (Signed)
The patient had a DVT study today which was negative for DVT.  She has also undergone a CT scan which I have reviewed which did not show any central venous thrombus or stenosis.  I do not think vascular disease is the primary cause of her upper extremity symptoms.  It certainly possible that she had a small superficial thrombophlebitis that resolved with Xarelto.  No further vascular work-up is planned at this time.

## 2019-11-11 NOTE — Progress Notes (Signed)
Patient ID: Karen Charles, female   DOB: 1970/06/21, 49 y.o.   MRN: [id number]  Chief Complaint  Patient presents with  . New Patient (Initial Visit)    ref Karen Charles lue ven dvt    HPI [name] is a 49 y.o. female.  I am asked to see the patient by Dr. Merlene Charles for evaluation of left arm pain.  The patient does have a previous history of blood clots.  She had she had an IVC filter placed and removed back in 2016 around the time of her bariatric surgery.  Her left arm pain felt like a very deep burning pain.  Xarelto made this better.  She denied any trauma, injury, or inciting event that started the symptoms.  There really was no swelling associated with this.  She had a CT scan earlier this month which I have independently reviewed which showed no arterial or venous abnormality that would explain her symptoms.  Specifically, they were looking for PE, aortic dissection, or central vein DVT and none were seen.  She continues on anticoagulation and is following with her hematologist.  We performed a left upper extremity venous study today to evaluate for DVT or superficial thrombophlebitis.  None were seen and this was a negative venous study today.     Past Medical History:  Diagnosis Date  . Asthma   . Diabetes mellitus without complication (HCC)   . GERD (gastroesophageal reflux disease)   . Headache   . Hx of blood clots   . Presence of IVC filter    inserted 07/12/2014    Past Surgical History:  Procedure Laterality Date  . CHOLECYSTECTOMY    . DILATION AND CURETTAGE OF UTERUS    . gallbladder removeral    . 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 TONSILLECTOMY        Family History  Problem Relation Age of Onset  . Lupus Mother   . Diabetes Father   . Congestive Heart Failure Father   . Hypertension Father   . Breast cancer Neg Hx      Social History   Tobacco Use  . Smoking status: Never Smoker  . Smokeless tobacco: Never Used  Vaping Use  . Vaping Use: Never used  Substance Use Topics  . Alcohol use: No  . Drug use: No     No Known Allergies  Current Outpatient Medications  Medication Sig Dispense Refill  . albuterol (PROVENTIL HFA;VENTOLIN HFA) 108 (90 BASE) MCG/ACT inhaler Inhale 1-2 puffs into the lungs every 6 (six) hours as needed for wheezing or shortness of breath.    Marland Kitchen aspirin 81 MG chewable tablet Chew by mouth daily.    . cetirizine (ZYRTEC) 10 MG tablet Take 10 mg by mouth daily as needed for allergies.    . Cholecalciferol (VITAMIN D3) 1.25 MG (50000 UT) TABS Take 50,000 Units by mouth once a week.    . cyanocobalamin 1000 MCG tablet Take by mouth.    . Multiple Vitamin (MULTIVITAMIN) tablet Take 1 tablet by mouth daily.    . pantoprazole (PROTONIX) 20 MG tablet Take 20 mg by mouth daily.    Marland Kitchen RIVAROXABAN (XARELTO) VTE STARTER PACK (15 & 20 MG TABLETS) Take 15 mg by  mouth 2 (two) times daily. Follow package directions: Take one 15mg  tablet by mouth twice a day. On day 22, switch to one 20mg  tablet once a day. Take with food. 51 each 0  . ergocalciferol (VITAMIN D2) 1.25 MG (50000 UT) capsule Take by mouth. (Patient not taking: Reported on 11/11/2019)     No current facility-administered medications for this visit.      REVIEW OF SYSTEMS (Negative unless checked)  Constitutional: [] Weight loss  [] Fever  [] Chills Cardiac: [] Chest pain   [] Chest pressure   [] Palpitations   [] Shortness of breath when laying flat   [] Shortness of breath at rest   [] Shortness of breath with exertion. Vascular:  [] Pain in legs with walking   [] Pain in legs at rest   [] Pain in legs when laying flat   [] Claudication   [] Pain in feet when  walking  [] Pain in feet at rest  [] Pain in feet when laying flat   [x] History of DVT   [x] Phlebitis   [x] Swelling in legs   [] Varicose veins   [] Non-healing ulcers Pulmonary:   [] Uses home oxygen   [] Productive cough   [] Hemoptysis   [] Wheeze  [] COPD   [x] Asthma Neurologic:  [] Dizziness  [] Blackouts   [] Seizures   [] History of stroke   [] History of TIA  [] Aphasia   [] Temporary blindness   [] Dysphagia   [] Weakness or numbness in arms   [] Weakness or numbness in legs Musculoskeletal:  [x] Arthritis   [] Joint swelling   [] Joint pain   [] Low back pain Hematologic:  [] Easy bruising  [] Easy bleeding   [] Hypercoagulable state   [] Anemic  [] Hepatitis Gastrointestinal:  [] Blood in stool   [] Vomiting blood  [] Gastroesophageal reflux/heartburn   [] Abdominal pain Genitourinary:  [] Chronic kidney disease   [] Difficult urination  [] Frequent urination  [] Burning with urination   [] Hematuria Skin:  [] Rashes   [] Ulcers   [] Wounds Psychological:  [] History of anxiety   []  History of major depression.    Physical Exam BP 116/77 (BP Location: Right Arm)   Pulse 67   Resp 16   Ht 5\' 8"  (1.727 m)   Wt 233 lb 9.6 oz (106 kg)   BMI 35.52 kg/m  Gen:  WD/WN, NAD Head: [place]/AT, No temporalis wasting.  Ear/Nose/Throat: Hearing grossly intact, nares w/o erythema or drainage, oropharynx w/o Erythema/Exudate Eyes: Conjunctiva clear, sclera non-icteric  Neck: trachea midline.  No JVD.  Pulmonary:  Good air movement, respirations not labored, no use of accessory muscles  Cardiac: RRR, no JVD Vascular:  Vessel Right Left  Radial Palpable Palpable                                   Gastrointestinal:. No masses, surgical incisions, or scars. Musculoskeletal: M/S 5/5 throughout.  Extremities without ischemic changes.  No deformity or atrophy.  No significant upper or lower extremity edema. Neurologic: Sensation grossly intact in extremities.  Symmetrical.  Speech is fluent. Motor exam as listed above. Psychiatric:  Judgment intact, Mood & affect appropriate for pt's clinical situation. Dermatologic: No rashes or ulcers noted.  No cellulitis or open wounds.    Radiology CT Angio Chest PE W and/or Wo Contrast  Result Date: 10/30/2019 CLINICAL DATA:  Right shoulder and chest pain, high prob PE. History of PE and DVT. EXAM: CT ANGIOGRAPHY CHEST WITH CONTRAST TECHNIQUE: Multidetector CT imaging of the chest was performed using the standard protocol during bolus administration of intravenous contrast. Multiplanar CT image reconstructions  and MIPs were obtained to evaluate the vascular anatomy. CONTRAST:  47mL OMNIPAQUE IOHEXOL 350 MG/ML SOLN COMPARISON:  05/26/2011 FINDINGS: Cardiovascular: Heart size normal. No pericardial effusion. The RV is nondilated. Satisfactory opacification of pulmonary arteries noted, and there is no evidence of pulmonary emboli. Adequate contrast opacification of the thoracic aorta with no evidence of dissection, aneurysm, or stenosis. There is classic 3-vessel brachiocephalic arch anatomy without proximal stenosis. No significant atheromatous change. Mediastinum/Nodes: No mass or adenopathy. Lungs/Pleura: No pleural effusion no pneumothorax. Small bleb in the right lower lobe. Lungs are otherwise clear. Upper Abdomen: Changes of interval gastric surgery. No acute findings. Musculoskeletal: No chest wall abnormality. No acute or significant osseous findings. Review of the MIP images confirms the above findings. IMPRESSION: Negative for acute PE or thoracic aortic dissection. Electronically Signed   By: Corlis Leak M.D.   On: 10/30/2019 11:05    Labs Recent Results (from the past 2160 hour(s))  Comprehensive metabolic panel     Status: Abnormal   Collection Time: 10/30/19  9:27 AM  Result Value Ref Range   Sodium 140 135 - 145 mmol/L   Potassium 4.8 3.5 - 5.1 mmol/L   Chloride 107 98 - 111 mmol/L   CO2 23 22 - 32 mmol/L   Glucose, Bld 85 70 - 99 mg/dL    Comment: Glucose reference range  applies only to samples taken after fasting for at least 8 hours.   BUN 8 6 - 20 mg/dL   Creatinine, Ser 1.61 (H) 0.44 - 1.00 mg/dL   Calcium 9.3 8.9 - 09.6 mg/dL   Total Protein 8.3 (H) 6.5 - 8.1 g/dL   Albumin 4.3 3.5 - 5.0 g/dL   AST 23 15 - 41 U/L   ALT 20 0 - 44 U/L   Alkaline Phosphatase 54 38 - 126 U/L   Total Bilirubin 1.6 (H) 0.3 - 1.2 mg/dL   GFR, Estimated 60 (L) >60 mL/min   Anion gap 10 5 - 15    Comment: Performed at [facility], [address], [place], [place] [address]  CBC with Differential     Status: Abnormal   Collection Time: 10/30/19  9:27 AM  Result Value Ref Range   WBC 5.1 4.0 - 10.5 K/uL   RBC 5.25 (H) 3.87 - 5.11 MIL/uL   Hemoglobin 15.2 (H) 12.0 - 15.0 g/dL   HCT 98.1 36 - 46 %   MCV 84.6 80.0 - 100.0 fL   MCH 29.0 26.0 - 34.0 pg   MCHC 34.2 30.0 - 36.0 g/dL   RDW 19.1 47.8 - 29.5 %   Platelets 276 150 - 400 K/uL   nRBC 0.0 0.0 - 0.2 %   Neutrophils Relative % 50 %   Neutro Abs 2.6 1.7 - 7.7 K/uL   Lymphocytes Relative 38 %   Lymphs Abs 1.9 0.7 - 4.0 K/uL   Monocytes Relative 8 %   Monocytes Absolute 0.4 0.1 - 1.0 K/uL   Eosinophils Relative 3 %   Eosinophils Absolute 0.1 0.0 - 0.5 K/uL   Basophils Relative 1 %   Basophils Absolute 0.0 0.0 - 0.1 K/uL   Immature Granulocytes 0 %   Abs Immature Granulocytes 0.01 0.00 - 0.07 K/uL    Comment: Performed at [facility], [address] [place], [place] [address]  Fibrin derivatives D-Dimer [facility] only)     Status: Abnormal   Collection Time: 10/30/19  9:27 AM  Result Value Ref Range   Fibrin derivatives  D-dimer ([facility]) 1,074.70 (H) 0.00 - 499.00 ng/mL (FEU)    Comment: (NOTE) <> Exclusion of Venous Thromboembolism (VTE) - OUTPATIENT ONLY   (Emergency Department or [place])    0-499 ng/ml (FEU): With a low to intermediate pretest probability                      for VTE this test result excludes the diagnosis                      of VTE.   >499 ng/ml (FEU) : VTE not  excluded; additional work up for VTE is                      required.  <> Testing on Inpatients and Evaluation of Disseminated Intravascular   Coagulation (DIC) Reference Range:   0-499 ng/ml (FEU) Performed at [facility], [address], [place], [place] [address]     Assessment/Plan:  Arm pain, left The patient had a DVT study today which was negative for DVT.  She has also undergone a CT scan which I have reviewed which did not show any central venous thrombus or stenosis.  I do not think vascular disease is the primary cause of her upper extremity symptoms.  It certainly possible that she had a small superficial thrombophlebitis that resolved with Xarelto.  No further vascular work-up is planned at this time.  History of DVT (deep vein thrombosis) The patient has a previous history of DVT but does not currently have any DVT identified.  She is on Xarelto and it may be reasonable to continue this given the low bleeding risk and her strong previous history.  This also seems to help her.  I will defer this to her hematologist.  I will see the patient back as needed.  DM2 (diabetes mellitus, type 2) (HCC) blood glucose control important in reducing the progression of atherosclerotic disease. Also, involved in wound healing. On appropriate medications.   HTN (hypertension) blood pressure control important in reducing the progression of atherosclerotic disease. On appropriate oral medications.       Festus Barren 11/11/2019, 9:23 AM   This note was created with Dragon medical transcription system.  Any errors from dictation are unintentional.

## 2019-11-11 NOTE — Patient Instructions (Signed)

## 2019-11-11 NOTE — Assessment & Plan Note (Signed)
The patient has a previous history of DVT but does not currently have any DVT identified.  She is on Xarelto and it may be reasonable to continue this given the low bleeding risk and her strong previous history.  This also seems to help her.  I will defer this to her hematologist.  I will see the patient back as needed.

## 2019-11-11 NOTE — Assessment & Plan Note (Signed)
blood glucose control important in reducing the progression of atherosclerotic disease. Also, involved in wound healing. On appropriate medications.  

## 2019-11-12 NOTE — Progress Notes (Signed)
[facility]  [address] [place], [place] [address] Phone: [phone]  Fax: [phone]   Clinic Day:  11/14/2019  Referring physician: Dortha Kern, MD  Chief Complaint: Karen Charles is a 49 y.o. 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   HPI: The patient was last seen int the hematology clinic on 11/07/2019 for an initial consult. At that time, she felt "alright".  She reported leg and arm aches if she laid down for too long.  She had hip pain. She bruised easily on her arms. She denied any fevers, sweats or weight loss.  Exam revealed no evidence of thrombosis.  [facility] labs revealed a hematocrit of 43.8, hemoglobin 14.4, MCV 87, platelets 240,000, WBC 6,000. CMP revealed a creatinine of 1.15 and normal LFTs. PT was 11.9 with an INR 1.1. D-dimers were 0.37 mg/L (normal).   Patient had an initial consult on 11/11/2019 with Dr. Festus Barren.  Left arm pain felt like a very deep burning pain.  Xarelto made this better.  She denied any trauma, injury, or inciting event that started the symptoms.  There really was no associated swelling. She continued on anticoagulation and was following with her hematologist.  Dr. Wyn Quaker performed a left upper extremity venous study to evaluate for DVT or superficial thrombophlebitis.  No thrombosis was seen; this was a negative venous study.   During the interim, she has felt "fine". She is tolerating Xarelto. Denies any bleeding. Her menstrual cycle has increased from 3 to 7 days while on Xarelto. She has been off birth control for 9 years. Denies feeling dizzy or lightheaded. The patient is interested in coming off Xarelto. Symptoms are stable from last week. Her arm and leg cramps have resolved.   Past Medical History:  Diagnosis Date  . Asthma   . Diabetes mellitus without complication (HCC)   . GERD  (gastroesophageal reflux disease)   . Headache   . Hx of blood clots   . Presence of IVC filter    inserted 07/12/2014    Past Surgical History:  Procedure Laterality Date  . CHOLECYSTECTOMY    . DILATION AND CURETTAGE OF UTERUS    . gallbladder removeral    . 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 TONSILLECTOMY      Family History  Problem Relation Age of Onset  . Lupus Mother   . Diabetes Father   . Congestive Heart Failure Father   . Hypertension Father   . Breast cancer Neg Hx     Social History:  reports that she has never smoked. She has never used smokeless tobacco. She reports that she does not drink alcohol and does not use drugs. The patient denies alcohol, tobacco, and drug use. She works from home for [employer]. She has been working there for 21 years. She denies any exposure to radiation or toxins. She has a son and a daughter. The patient is alone today.  Allergies: No Known Allergies  Current Medications: Current Outpatient Medications  Medication Sig Dispense Refill  . albuterol (PROVENTIL HFA;VENTOLIN HFA) 108 (90 BASE) MCG/ACT inhaler Inhale 1-2 puffs into the lungs  every 6 (six) hours as needed for wheezing or shortness of breath.    Marland Kitchen aspirin 81 MG chewable tablet Chew by mouth daily.    . cetirizine (ZYRTEC) 10 MG tablet Take 10 mg by mouth daily as needed for allergies.    . Cholecalciferol (VITAMIN D3) 1.25 MG (50000 UT) TABS Take 50,000 Units by mouth once a week.    . cyanocobalamin 1000 MCG tablet Take by mouth.    . Multiple Vitamin (MULTIVITAMIN) tablet Take 1 tablet by mouth daily.    . pantoprazole (PROTONIX) 20 MG tablet Take 20 mg by mouth daily.     Marland Kitchen RIVAROXABAN (XARELTO) VTE STARTER PACK (15 & 20 MG TABLETS) Take 15 mg by mouth 2 (two) times daily. Follow package directions: Take one 15mg  tablet by mouth twice a day. On day 22, switch to one 20mg  tablet once a day. Take with food. 51 each 0  . ergocalciferol (VITAMIN D2) 1.25 MG (50000 UT) capsule Take by mouth. (Patient not taking: Reported on 11/11/2019)     No current facility-administered medications for this visit.    Review of Systems  Constitutional: Negative for chills, diaphoresis, fever, malaise/fatigue and weight loss (stable).       Feels "fine".  HENT: Negative for congestion, ear discharge, ear pain, hearing loss, nosebleeds, sinus pain, sore throat and tinnitus.   Eyes: Negative for blurred vision.  Respiratory: Negative for cough, hemoptysis, sputum production and shortness of breath.   Cardiovascular: Negative for chest pain, palpitations and leg swelling.  Gastrointestinal: Negative for abdominal pain, blood in stool, constipation, diarrhea, heartburn, melena, nausea and vomiting.  Genitourinary: Negative for dysuria, frequency, hematuria and urgency.  Musculoskeletal: Negative for back pain, joint pain (hips), myalgias (arm and leg aches, resolved) and neck pain.  Skin: Negative for itching and rash.  Neurological: Positive for tingling (left arm, when she lays down). Negative for dizziness, sensory change, weakness and headaches.  Endo/Heme/Allergies: Bruises/bleeds easily (bruising on Xarelto).  Psychiatric/Behavioral: Negative for depression and memory loss. The patient has insomnia (due to muscle aches). The patient is not nervous/anxious.   All other systems reviewed and are negative.  Performance status (ECOG): 1  Vitals Blood pressure 118/65, pulse 71, temperature 97.7 F (36.5 C), temperature source Tympanic, weight 236 lb 15.9 oz (107.5 kg), SpO2 100 %.   Physical Exam Vitals and nursing note reviewed.  Constitutional:      General: She is not in  acute distress.    Appearance: She is not diaphoretic.  HENT:     Head: Normocephalic and atraumatic.     Comments: Styled dark hair. Eyes:     General: No scleral icterus.    Extraocular Movements: Extraocular movements intact.     Conjunctiva/sclera: Conjunctivae normal.     Comments: Brown eyes.  Musculoskeletal:        General: No swelling. Normal range of motion.  Neurological:     Mental Status: She is alert and oriented to person, place, and time.  Psychiatric:        Behavior: Behavior normal.        Thought Content: Thought content normal.        Judgment: Judgment normal.    No visits with results within 3 Day(s) from this visit.  Latest known visit with results is:  Admission on 10/30/2019, Discharged on 10/30/2019  Component Date Value Ref Range Status  . Sodium 10/30/2019 140  135 - 145 mmol/L Final  . Potassium 10/30/2019 4.8  3.5 - 5.1  mmol/L Final  . Chloride 10/30/2019 107  98 - 111 mmol/L Final  . CO2 10/30/2019 23  22 - 32 mmol/L Final  . Glucose, Bld 10/30/2019 85  70 - 99 mg/dL Final   Glucose reference range applies only to samples taken after fasting for at least 8 hours.  . BUN 10/30/2019 8  6 - 20 mg/dL Final  . Creatinine, Ser 10/30/2019 1.11* 0.44 - 1.00 mg/dL Final  . Calcium 96/29/5284 9.3  8.9 - 10.3 mg/dL Final  . Total Protein 10/30/2019 8.3* 6.5 - 8.1 g/dL Final  . Albumin 13/24/4010 4.3  3.5 - 5.0 g/dL Final  . AST 27/25/3664 23  15 - 41 U/L Final  . ALT 10/30/2019 20  0 - 44 U/L Final  . Alkaline Phosphatase 10/30/2019 54  38 - 126 U/L Final  . Total Bilirubin 10/30/2019 1.6* 0.3 - 1.2 mg/dL Final  . GFR, Estimated 10/30/2019 60* >60 mL/min Final  . Anion gap 10/30/2019 10  5 - 15 Final   Performed at [facility], [address], [place], [place] [address]  . WBC 10/30/2019 5.1  4.0 - 10.5 K/uL Final  . RBC 10/30/2019 5.25* 3.87 - 5.11 MIL/uL Final  . Hemoglobin 10/30/2019 15.2* 12.0 - 15.0 g/dL Final  . HCT 42/59/5638 44.4   36 - 46 % Final  . MCV 10/30/2019 84.6  80.0 - 100.0 fL Final  . MCH 10/30/2019 29.0  26.0 - 34.0 pg Final  . MCHC 10/30/2019 34.2  30.0 - 36.0 g/dL Final  . RDW 75/64/3329 13.2  11.5 - 15.5 % Final  . Platelets 10/30/2019 276  150 - 400 K/uL Final  . nRBC 10/30/2019 0.0  0.0 - 0.2 % Final  . Neutrophils Relative % 10/30/2019 50  % Final  . Neutro Abs 10/30/2019 2.6  1.7 - 7.7 K/uL Final  . Lymphocytes Relative 10/30/2019 38  % Final  . Lymphs Abs 10/30/2019 1.9  0.7 - 4.0 K/uL Final  . Monocytes Relative 10/30/2019 8  % Final  . Monocytes Absolute 10/30/2019 0.4  0.1 - 1.0 K/uL Final  . Eosinophils Relative 10/30/2019 3  % Final  . Eosinophils Absolute 10/30/2019 0.1  0.0 - 0.5 K/uL Final  . Basophils Relative 10/30/2019 1  % Final  . Basophils Absolute 10/30/2019 0.0  0.0 - 0.1 K/uL Final  . Immature Granulocytes 10/30/2019 0  % Final  . Abs Immature Granulocytes 10/30/2019 0.01  0.00 - 0.07 K/uL Final   Performed at [facility], [address], [place], [place] [address]  . Fibrin derivatives D-dimer ([facility]) 10/30/2019 1,074.70* 0.00 - 499.00 ng/mL (FEU) Final   Comment: (NOTE) <> Exclusion of Venous Thromboembolism (VTE) - OUTPATIENT ONLY   (Emergency Department or [place])    0-499 ng/ml (FEU): With a low to intermediate pretest probability                      for VTE this test result excludes the diagnosis                      of VTE.   >499 ng/ml (FEU) : VTE not excluded; additional work up for VTE is                      required.  <> Testing on Inpatients and Evaluation of Disseminated Intravascular   Coagulation (DIC) Reference Range:   0-499 ng/ml (FEU) Performed at [facility], [address] [place], [place] [id number]     Assessment:  Karen Charles is a 49 y.o. female with a history of pulmonary embolism and right lower extremity DVT.  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  Chest CT on 09/04/2010 revealed  bilateral pulmonary emboli.  There was thrombus in the left main pulmonary artery, and segmental as well as subsegmental branches of the right lower lobe, left lower lobe, right upper lobe and left upper lobe pulmonary arteries c/w pulmonary emboli. The main pulmonary artery, right main pulmonary artery, and left main pulmonary arteries were normal in size.  Bilateral lower extremity venous duplex on 09/05/2010 revealed a nonocclusive deep vein thrombosis involving the upper portion of the superficial femoral on the right. There was no deep vein thrombosis seen on the left. She had a Mirena IUD at the time. She believes she was treated with Coumadin < than a year.   [facility] labs on 11/07/2019 revealed a hematocrit of 43.8, hemoglobin 14.4, MCV 87, platelets 240,000, WBC 6,000. CMP revealed a creatinine of 1.15 and normal LFTs. PT was 11.9 with an INR 1.1. D-dimers were 0.37 mg/L (normal).  Factor V Leiden and prothrombin gene mutation were negative.  She has had unusual left arm and shoulder pain.  Vascular surgery evaluation revealed no thrombosis.  She denies any family history of blood disorders.  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  She takes Vitamin D3, vitamin B12 5,000 mcg, and oral iron once daily.   The patient received the Pfizer COVID-19 vaccine on 03/26/20201 and 05/09/2019.  Symptomatically, she feels "fine". She is tolerating Xarelto. She denies any bleeding. Her menstrual cycle has increased from 3 to 7 days. She has been off birth control for 9 years. Arm and leg cramps have resolved.  Plan: 1.   [facility] labs today: protein C, protein S, ATIII activity and antigen. 2.   History of PE and DVT             Patient is s/p DVT and PET in 08/2010.                         Clot associated with Mirena IUD.             She has had unusual arm pain on 2 separate occasions (01/2019 and 10/30/2019).                         With the first episode, symptoms resolved with aspirin.                          With the second episode, she was started on Xarelto.                                     Chect CT angiogram was negative.             Left lower extremity venous ultrasound on 05/02/2019 revealed no evidence of acute or chronic DVT.             Review vascular surgery consult.   No evidence of thrombosis.   D-dimers are negative.             Follow-up lupus anticoagulant panel, anticardiolipin antibodies, beta2-glycoprotein antibodies.  Continue Xarelto for now. 3.   Elevated d-dimers, resolved             D-dimer was 1074.70 (0-499) on 10/30/2019.  D-dimer was 0.37 (0-0.49) on 11/07/2019.             Chest CT angiogram on 10/30/2019 revealed no evidence of pulmonary embolism.             Vascular surgery evaluation revealed no evidence of upper extremity thrombosis. 4.   History of bariatric surgery             Hematocrit 44.4.  Hemoglobin 15.2.  MCV 84.6 on 10/30/2019.             Patient at risk for iron deficiency anemia and B12 deficiency.             Monitor ferritin, iron studies, B12. 5.   RN: Call patient with lab results pending. 6.   If lupus anticoagulant +, then will repeat off Xarelto (RN to call if needed and review directions). 7.   RTC in 1 month for MD assessment and summary of results.  I discussed the assessment and treatment plan with the patient.  The patient was provided an opportunity to ask questions and all were answered.  The patient agreed with the plan and demonstrated an understanding of the instructions.  The patient was advised to call back if the symptoms worsen or if the condition fails to improve as anticipated.  I provided 25 minutes of face-to-face time during this encounter and > 50% was spent counseling as documented under my assessment and plan.  An additional 5 minutes were spent reviewing her chart (Epic and Care Everywhere) including notes, labs, and imaging studies.    Jahmeer Porche C. Merlene Pulling, MD, PhD    11/14/2019, 2:26  PM  I, Theador Hawthorne, am acting as scribe for [employer]. Merlene Pulling, MD, PhD.  I, Vanecia Limpert C. Merlene Pulling, MD, have reviewed the above documentation for accuracy and completeness, and I agree with the above.

## 2019-11-14 ENCOUNTER — Inpatient Hospital Stay (HOSPITAL_BASED_OUTPATIENT_CLINIC_OR_DEPARTMENT_OTHER): Payer: BC Managed Care – PPO | Admitting: Hematology and Oncology

## 2019-11-14 ENCOUNTER — Encounter: Payer: Self-pay | Admitting: Hematology and Oncology

## 2019-11-14 ENCOUNTER — Other Ambulatory Visit: Payer: Self-pay

## 2019-11-14 ENCOUNTER — Telehealth: Payer: Self-pay

## 2019-11-14 VITALS — BP 118/65 | HR 71 | Temp 97.7°F | Wt 237.0 lb

## 2019-11-14 DIAGNOSIS — Z9884 Bariatric surgery status: Secondary | ICD-10-CM | POA: Diagnosis not present

## 2019-11-14 DIAGNOSIS — Z86711 Personal history of pulmonary embolism: Secondary | ICD-10-CM | POA: Diagnosis not present

## 2019-11-14 DIAGNOSIS — Z7901 Long term (current) use of anticoagulants: Secondary | ICD-10-CM | POA: Diagnosis not present

## 2019-11-14 DIAGNOSIS — Z86718 Personal history of other venous thrombosis and embolism: Secondary | ICD-10-CM

## 2019-11-14 DIAGNOSIS — R7989 Other specified abnormal findings of blood chemistry: Secondary | ICD-10-CM | POA: Diagnosis not present

## 2019-11-14 NOTE — Progress Notes (Signed)
No new changes noted today 

## 2019-11-14 NOTE — Telephone Encounter (Signed)
Error

## 2019-11-15 ENCOUNTER — Encounter: Payer: Self-pay | Admitting: Hematology and Oncology

## 2019-11-17 ENCOUNTER — Telehealth: Payer: Self-pay | Admitting: Hematology and Oncology

## 2019-11-17 ENCOUNTER — Encounter: Payer: Self-pay | Admitting: Hematology and Oncology

## 2019-11-23 ENCOUNTER — Encounter: Payer: Self-pay | Admitting: Hematology and Oncology

## 2019-11-23 NOTE — Telephone Encounter (Signed)
error 

## 2019-12-22 ENCOUNTER — Inpatient Hospital Stay: Payer: BC Managed Care – PPO | Attending: Hematology and Oncology | Admitting: Hematology and Oncology

## 2019-12-30 ENCOUNTER — Encounter: Payer: Self-pay | Admitting: Hematology and Oncology

## 2020-01-30 ENCOUNTER — Encounter: Payer: Self-pay | Admitting: Emergency Medicine

## 2020-01-30 ENCOUNTER — Other Ambulatory Visit: Payer: Self-pay

## 2020-01-30 ENCOUNTER — Ambulatory Visit
Admission: EM | Admit: 2020-01-30 | Discharge: 2020-01-30 | Disposition: A | Discharge status: Home / Self Care | Payer: BC Managed Care – PPO | Attending: Family Medicine | Admitting: Family Medicine

## 2020-01-30 DIAGNOSIS — U071 COVID-19: Secondary | ICD-10-CM | POA: Insufficient documentation

## 2020-01-30 DIAGNOSIS — J069 Acute upper respiratory infection, unspecified: Secondary | ICD-10-CM | POA: Diagnosis not present

## 2020-01-30 DIAGNOSIS — Z20822 Contact with and (suspected) exposure to covid-19: Secondary | ICD-10-CM | POA: Diagnosis present

## 2020-01-30 LAB — SARS CORONAVIRUS 2 (TAT 6-24 HRS): SARS Coronavirus 2: POSITIVE — AB

## 2020-01-30 MED ORDER — IPRATROPIUM BROMIDE 0.06 % NA SOLN: 2.0000 | Freq: Four times a day (QID) | NASAL | 0 refills | Status: DC | PRN

## 2020-01-30 MED ORDER — BENZONATATE 200 MG PO CAPS: 200.0000 mg | ORAL_CAPSULE | Freq: Three times a day (TID) | ORAL | 0 refills | Status: DC | PRN

## 2020-01-30 MED ORDER — NAPROXEN 500 MG PO TABS: 500.0000 mg | ORAL_TABLET | Freq: Two times a day (BID) | ORAL | 0 refills | Status: DC | PRN

## 2020-01-30 NOTE — Discharge Instructions (Signed)
Medication as prescribed.  Stay home.  Check my chart for COVID test results.  Take care  Dr. Donisha Hoch   

## 2020-01-30 NOTE — ED Triage Notes (Signed)
Pt c/o cough, runny nose, body aches, chills. Started yesterday.

## 2020-01-30 NOTE — ED Provider Notes (Signed)
[facility]    CSN: [id number] Arrival date & time: 01/30/20  0849      History   Chief Complaint Chief Complaint  Patient presents with  . Cough  . Fever   HPI  50 year old female presents with respiratory symptoms and fever.  Symptoms started yesterday.  She reports fever, body aches, sore throat, runny nose, congestion, cough.  Temperature currently 100.3.  She states that there have been several cases of COVID-19 at work.  No reported direct exposure.  No relieving factors.  She feels very poorly.  Rates her pain as 10/10 in severity.  No other associated symptoms.  No other complaints.  Past Medical History:  Diagnosis Date  . Asthma   . Diabetes mellitus without complication (HCC)   . GERD (gastroesophageal reflux disease)   . Headache   . Hx of blood clots   . Presence of IVC filter    inserted 07/12/2014    Patient Active Problem List   Diagnosis Date Noted  . Vitamin D deficiency 11/11/2019  . Arm pain, left 11/11/2019  . S/P bariatric surgery 11/07/2019  . Elevated d-dimer 11/07/2019  . History of DVT (deep vein thrombosis) 11/02/2019  . History of pulmonary embolism 11/02/2019  . DM2 (diabetes mellitus, type 2) (HCC) 05/29/2016  . HTN (hypertension) 05/29/2016  . Missed abortion 05/29/2016  . Sickle cell trait (HCC) 05/29/2016  . Unwanted fertility 05/29/2016  . Bariatric surgery status 07/18/2014    Past Surgical History:  Procedure Laterality Date  . CHOLECYSTECTOMY    . DILATION AND CURETTAGE OF UTERUS    . gallbladder removeral    . 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 TONSILLECTOMY      OB History   No obstetric history on  file.      Home Medications    Prior to Admission medications   Medication Sig Start Date End Date Taking? Authorizing Provider  albuterol (PROVENTIL HFA;VENTOLIN HFA) 108 (90 BASE) MCG/ACT inhaler Inhale 1-2 puffs into the lungs every 6 (six) hours as needed for wheezing or shortness of breath.   Yes [provider]  benzonatate (TESSALON) 200 MG capsule Take 1 capsule (200 mg total) by mouth 3 (three) times daily as needed for cough. 01/30/20  Yes Cook, Jayce G, DO  cetirizine (ZYRTEC) 10 MG tablet Take 10 mg by mouth daily as needed for allergies.   Yes [provider]  Cholecalciferol (VITAMIN D3) 1.25 MG (50000 UT) TABS Take 50,000 Units by mouth once a week.   Yes [provider]  cyanocobalamin 1000 MCG tablet Take by mouth.   Yes [provider]  ipratropium (ATROVENT) 0.06 % nasal spray Place 2 sprays into both nostrils 4 (four) times daily as needed for rhinitis. 01/30/20  Yes Tommie Sams, DO  Multiple Vitamin (MULTIVITAMIN) tablet Take 1 tablet by mouth daily.   Yes [provider]  naproxen (NAPROSYN) 500 MG tablet Take 1 tablet (500 mg total) by mouth 2 (two) times daily as needed for moderate pain or headache. 01/30/20  Yes Cook, Jayce G, DO  pantoprazole (PROTONIX) 20 MG tablet Take 20 mg by mouth daily. 07/04/19  Yes [provider]  ergocalciferol (VITAMIN D2) 1.25 MG (50000 UT) capsule Take by  mouth. Patient not taking: Reported on 11/11/2019    [provider]  enoxaparin (LOVENOX) 40 MG/0.4ML injection Inject 0.4 mLs (40 mg total) into the skin every 12 (twelve) hours. 07/20/14 04/09/15  Everette Rank, MD  RIVAROXABAN Carlena Hurl) VTE STARTER PACK (15 & 20 MG TABLETS) Take 15 mg by mouth 2 (two) times daily. Follow package directions: Take one 15mg  tablet by mouth twice a day. On day 22, switch to one 20mg  tablet once a day. Take with food. 10/30/19 01/30/20  12/30/19, NP    Family History Family History   Problem Relation Age of Onset  . Lupus Mother   . Diabetes Father   . Congestive Heart Failure Father   . Hypertension Father   . Breast cancer Neg Hx     Social History Social History   Tobacco Use  . Smoking status: Never Smoker  . Smokeless tobacco: Never Used  Vaping Use  . Vaping Use: Never used  Substance Use Topics  . Alcohol use: No  . Drug use: No     Allergies   Patient has no known allergies.   Review of Systems Review of Systems  Constitutional: Positive for fever.  HENT: Positive for congestion, rhinorrhea and sore throat.   Respiratory: Positive for cough.   Musculoskeletal:       Body aches.    Physical Exam Triage Vital Signs ED Triage Vitals  Enc Vitals Group     BP 01/30/20 0917 (!) 185/116     Pulse Rate 01/30/20 0917 (!) 108     Resp 01/30/20 0917 18     Temp 01/30/20 0917 100.3 F (37.9 C)     Temp Source 01/30/20 0917 Oral     SpO2 01/30/20 0917 97 %     Weight 01/30/20 0915 236 lb 15.9 oz (107.5 kg)     Height 01/30/20 0915 5\' 8"  (1.727 m)     Head Circumference --      Peak Flow --      Pain Score 01/30/20 0915 10     Pain Loc --      Pain Edu? --      Excl. in GC? --    Updated Vital Signs BP (!) 185/116 (BP Location: Left Arm)   Pulse (!) 108   Temp 100.3 F (37.9 C) (Oral)   Resp 18   Ht 5\' 8"  (1.727 m)   Wt 107.5 kg   LMP 01/16/2020   SpO2 97%   BMI 36.03 kg/m   Visual Acuity Right Eye Distance:   Left Eye Distance:   Bilateral Distance:    Right Eye Near:   Left Eye Near:    Bilateral Near:     Physical Exam Vitals and nursing note reviewed.  Constitutional:      General: She is not in acute distress.    Appearance: Normal appearance. She is not ill-appearing.  HENT:     Head: Normocephalic and atraumatic.     Mouth/Throat:     Pharynx: Posterior oropharyngeal erythema present. No oropharyngeal exudate.  Eyes:     General:        Right eye: No discharge.        Left eye: No discharge.      Conjunctiva/sclera: Conjunctivae normal.  Cardiovascular:     Rate and Rhythm: Normal rate and regular rhythm.  Pulmonary:     Effort: Pulmonary effort is normal.     Breath sounds: Normal breath sounds. No wheezing, rhonchi or rales.  Neurological:     Mental Status: She is alert.  Psychiatric:        Mood and Affect: Mood normal.        Behavior: Behavior normal.    UC Treatments / Results  Labs (all labs ordered are listed, but only abnormal results are displayed) Labs Reviewed  SARS CORONAVIRUS 2 (TAT 6-24 HRS)    EKG   Radiology No results found.  Procedures Procedures (including critical care time)  Medications Ordered in UC Medications - No data to display  Initial Impression / Assessment and Plan / UC Course  I have reviewed the triage vital signs and the nursing notes.  Pertinent labs & imaging results that were available during my care of the patient were reviewed by me and considered in my medical decision making (see chart for details).    50 year old female presents with a viral URI with cough.  This is an acute illness with systemic symptoms as she is having fever.  Suspected COVID-19.  Awaiting test result.  Treating symptomatically with Atrovent nasal spray, Tessalon Perles, naproxen.  Final Clinical Impressions(s) / UC Diagnoses   Final diagnoses:  Viral URI with cough  Suspected COVID-19 virus infection     Discharge Instructions     Medication as prescribed.  Stay home.  Check my chart for COVID test results.  Take care  Dr. Adriana Simas     ED Prescriptions    Medication Sig Dispense Auth. Provider   ipratropium (ATROVENT) 0.06 % nasal spray Place 2 sprays into both nostrils 4 (four) times daily as needed for rhinitis. 15 mL Cook, Jayce G, DO   benzonatate (TESSALON) 200 MG capsule Take 1 capsule (200 mg total) by mouth 3 (three) times daily as needed for cough. 30 capsule Cook, Jayce G, DO   naproxen (NAPROSYN) 500 MG tablet Take 1 tablet  (500 mg total) by mouth 2 (two) times daily as needed for moderate pain or headache. 30 tablet Tommie Sams, DO     PDMP not reviewed this encounter.   Tommie Sams, [place] 01/30/20 1007

## 2020-06-23 ENCOUNTER — Other Ambulatory Visit: Payer: Self-pay

## 2020-06-23 ENCOUNTER — Ambulatory Visit
Admission: EM | Admit: 2020-06-23 | Discharge: 2020-06-23 | Disposition: A | Discharge status: Home / Self Care | Payer: BC Managed Care – PPO | Attending: Emergency Medicine | Admitting: Emergency Medicine

## 2020-06-23 ENCOUNTER — Encounter: Payer: Self-pay | Admitting: Emergency Medicine

## 2020-06-23 DIAGNOSIS — L0291 Cutaneous abscess, unspecified: Secondary | ICD-10-CM | POA: Diagnosis not present

## 2020-06-23 MED ORDER — CEPHALEXIN 500 MG PO CAPS: 500.0000 mg | ORAL_CAPSULE | Freq: Three times a day (TID) | ORAL | 0 refills | Status: AC

## 2020-06-23 NOTE — ED Provider Notes (Signed)
[facility]    CSN: [id number] Arrival date & time: 06/23/20  1340      History   Chief Complaint Chief Complaint  Patient presents with  . Abscess    Left armpit    HPI Karen Charles is a 50 y.o. female.   Karen Charles presents with complaints of draining abscess to left lateral chest wall/ below left axilla. Had noted a "bump" to the area maybe weeks ago which had been non tender, but then around 4 days ago it increased in size and became painful. Two days ago started draining. Denies any previous similar. No fevers. No known MRSA history.    ROS per HPI, negative if not otherwise mentioned.      Past Medical History:  Diagnosis Date  . Asthma   . Diabetes mellitus without complication (HCC)   . GERD (gastroesophageal reflux disease)   . Headache   . Hx of blood clots   . Presence of IVC filter    inserted 07/12/2014    Patient Active Problem List   Diagnosis Date Noted  . Vitamin D deficiency 11/11/2019  . Arm pain, left 11/11/2019  . S/P bariatric surgery 11/07/2019  . Elevated d-dimer 11/07/2019  . History of DVT (deep vein thrombosis) 11/02/2019  . History of pulmonary embolism 11/02/2019  . DM2 (diabetes mellitus, type 2) (HCC) 05/29/2016  . HTN (hypertension) 05/29/2016  . Missed abortion 05/29/2016  . Sickle cell trait (HCC) 05/29/2016  . Unwanted fertility 05/29/2016  . Bariatric surgery status 07/18/2014    Past Surgical History:  Procedure Laterality Date  . CHOLECYSTECTOMY    . DILATION AND CURETTAGE OF UTERUS    . gallbladder removeral    . 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 TONSILLECTOMY      OB History   No  obstetric history on file.      Home Medications    Prior to Admission medications   Medication Sig Start Date End Date Taking? Authorizing Provider  cephALEXin (KEFLEX) 500 MG capsule Take 1 capsule (500 mg total) by mouth 3 (three) times daily for 7 days. 06/23/20 06/30/20 Yes Doran Nestle, Barron Alvine, NP  cetirizine (ZYRTEC) 10 MG tablet Take 10 mg by mouth daily as needed for allergies.   Yes [provider]  Cholecalciferol (VITAMIN D3) 1.25 MG (50000 UT) TABS Take 50,000 Units by mouth once a week.   Yes [provider]  cyanocobalamin 1000 MCG tablet Take by mouth.   Yes [provider]  ergocalciferol (VITAMIN D2) 1.25 MG (50000 UT) capsule Take by mouth.   Yes [provider]  Multiple Vitamin (MULTIVITAMIN) tablet Take 1 tablet by mouth daily.   Yes [provider]  albuterol (PROVENTIL HFA;VENTOLIN HFA) 108 (90 BASE) MCG/ACT inhaler Inhale 1-2 puffs into the lungs every 6 (six) hours as needed for wheezing or shortness of breath.    [provider]  benzonatate (TESSALON) 200 MG capsule Take 1 capsule (200 mg total) by mouth 3 (three) times daily as needed for cough. 01/30/20   Tommie Sams, DO  ipratropium (ATROVENT) 0.06 % nasal spray Place 2 sprays into both nostrils 4 (four) times daily as needed for rhinitis. 01/30/20   Tommie Sams, DO  naproxen (NAPROSYN) 500 MG tablet Take 1 tablet (500 mg total) by mouth 2 (two) times daily as needed for moderate pain or headache. 01/30/20   Tommie Sams, DO  pantoprazole (PROTONIX) 20 MG tablet Take 20 mg by mouth daily. 07/04/19   [provider]  enoxaparin (LOVENOX) 40 MG/0.4ML injection Inject 0.4 mLs (40 mg total) into the skin every 12 (twelve) hours. 07/20/14 04/09/15  Everette Rank, MD  RIVAROXABAN Carlena Hurl) VTE STARTER PACK (15 & 20 MG TABLETS) Take 15 mg by mouth 2 (two) times daily. Follow package directions: Take one 15mg  tablet by mouth twice a day. On day 22, switch to one  20mg  tablet once a day. Take with food. 10/30/19 01/30/20  12/30/19, NP    Family History Family History  Problem Relation Age of Onset  . Lupus Mother   . Diabetes Father   . Congestive Heart Failure Father   . Hypertension Father   . Breast cancer Neg Hx     Social History Social History   Tobacco Use  . Smoking status: Never Smoker  . Smokeless tobacco: Never Used  Vaping Use  . Vaping Use: Never used  Substance Use Topics  . Alcohol use: No  . Drug use: No     Allergies   Patient has no known allergies.   Review of Systems Review of Systems   Physical Exam Triage Vital Signs ED Triage Vitals  Enc Vitals Group     BP 06/23/20 1406 117/78     Pulse Rate 06/23/20 1406 60     Resp 06/23/20 1406 14     Temp 06/23/20 1406 98.8 F (37.1 C)     Temp Source 06/23/20 1406 Oral     SpO2 06/23/20 1406 99 %     Weight 06/23/20 1404 225 lb (102.1 kg)     Height 06/23/20 1404 5\' 8"  (1.727 m)     Head Circumference --      Peak Flow --      Pain Score 06/23/20 1404 8     Pain Loc --      Pain Edu? --      Excl. in GC? --    No data found.  Updated Vital Signs BP 117/78 (BP Location: Right Arm)   Pulse 60   Temp 98.8 F (37.1 C) (Oral)   Resp 14   Ht 5\' 8"  (1.727 m)   Wt 225 lb (102.1 kg)   LMP 06/09/2020 (Approximate)   SpO2 99%   BMI 34.21 kg/m   Visual Acuity Right Eye Distance:   Left Eye Distance:   Bilateral Distance:    Right Eye Near:   Left Eye Near:    Bilateral Near:     Physical Exam Constitutional:      General: She is not in acute distress.    Appearance: She is well-developed.  Cardiovascular:     Rate and Rhythm: Normal rate.  Pulmonary:     Effort: Pulmonary effort is normal.  Skin:    General: Skin is warm and dry.          Comments: Purulent drainage from abscess to lateral chest wall just under left axilla; minimal tenderness; no induration, and with minimal additional fluctuance   Neurological:     Mental Status:  She is alert and oriented to person, place, and time.      UC Treatments / Results  Labs (all labs ordered are listed, but only abnormal results are displayed) Labs  Reviewed - No data to display  EKG   Radiology No results found.  Procedures Procedures (including critical care time)  Medications Ordered in UC Medications - No data to display  Initial Impression / Assessment and Plan / UC Course  I have reviewed the triage vital signs and the nursing notes.  Pertinent labs & imaging results that were available during my care of the patient were reviewed by me and considered in my medical decision making (see chart for details).     Draining small abscess, no indication for further incision. Antibiotics provided and care recommendations discussed. Return precautions provided. Patient verbalized understanding and agreeable to plan.   Final Clinical Impressions(s) / UC Diagnoses   Final diagnoses:  Abscess     Discharge Instructions     Apply heat to promote additional drainage.  Complete course of antibiotics.  If symptoms worsen or do not improve in the next week to return to be seen or to follow up with your PCP.     ED Prescriptions    Medication Sig Dispense Auth. Provider   cephALEXin (KEFLEX) 500 MG capsule Take 1 capsule (500 mg total) by mouth 3 (three) times daily for 7 days. 21 capsule Georgetta Haber, NP     PDMP not reviewed this encounter.   Georgetta Haber, NP 06/23/20 1436

## 2020-06-23 NOTE — ED Triage Notes (Signed)
Patient c/o abscess under left armpit that started couple of days ago.  Patient reports drainage on Thursday.  Patient denies fevers.

## 2020-06-23 NOTE — Discharge Instructions (Signed)
Apply heat to promote additional drainage.  Complete course of antibiotics.  If symptoms worsen or do not improve in the next week to return to be seen or to follow up with your PCP.

## 2021-10-13 ENCOUNTER — Encounter: Payer: Self-pay | Admitting: Emergency Medicine

## 2021-10-13 ENCOUNTER — Emergency Department: Payer: BC Managed Care – PPO

## 2021-10-13 ENCOUNTER — Other Ambulatory Visit: Payer: Self-pay

## 2021-10-13 ENCOUNTER — Ambulatory Visit
Admission: EM | Admit: 2021-10-13 | Discharge: 2021-10-13 | Disposition: A | Discharge status: Other Acute Inpatient Hospital | Payer: BC Managed Care – PPO | Attending: Family Medicine | Admitting: Family Medicine

## 2021-10-13 ENCOUNTER — Emergency Department
Admission: EM | Admit: 2021-10-13 | Discharge: 2021-10-13 | Disposition: A | Discharge status: Home / Self Care | Payer: BC Managed Care – PPO | Attending: Emergency Medicine | Admitting: Emergency Medicine

## 2021-10-13 DIAGNOSIS — Z86718 Personal history of other venous thrombosis and embolism: Secondary | ICD-10-CM

## 2021-10-13 DIAGNOSIS — Z86711 Personal history of pulmonary embolism: Secondary | ICD-10-CM | POA: Diagnosis not present

## 2021-10-13 DIAGNOSIS — I10 Essential (primary) hypertension: Secondary | ICD-10-CM | POA: Diagnosis not present

## 2021-10-13 DIAGNOSIS — E119 Type 2 diabetes mellitus without complications: Secondary | ICD-10-CM | POA: Diagnosis not present

## 2021-10-13 DIAGNOSIS — M79601 Pain in right arm: Secondary | ICD-10-CM | POA: Insufficient documentation

## 2021-10-13 LAB — CBC WITH DIFFERENTIAL/PLATELET
Abs Immature Granulocytes: 0 10*3/uL (ref 0.00–0.07)
Basophils Absolute: 0 10*3/uL (ref 0.0–0.1)
Basophils Relative: 1 %
Eosinophils Absolute: 0.1 10*3/uL (ref 0.0–0.5)
Eosinophils Relative: 2 %
HCT: 34.6 % — ABNORMAL LOW (ref 36.0–46.0)
Hemoglobin: 11 g/dL — ABNORMAL LOW (ref 12.0–15.0)
Immature Granulocytes: 0 %
Lymphocytes Relative: 54 %
Lymphs Abs: 2 10*3/uL (ref 0.7–4.0)
MCH: 24.7 pg — ABNORMAL LOW (ref 26.0–34.0)
MCHC: 31.8 g/dL (ref 30.0–36.0)
MCV: 77.6 fL — ABNORMAL LOW (ref 80.0–100.0)
Monocytes Absolute: 0.3 10*3/uL (ref 0.1–1.0)
Monocytes Relative: 8 %
Neutro Abs: 1.3 10*3/uL — ABNORMAL LOW (ref 1.7–7.7)
Neutrophils Relative %: 35 %
Platelets: 302 10*3/uL (ref 150–400)
RBC: 4.46 MIL/uL (ref 3.87–5.11)
RDW: 15.1 % (ref 11.5–15.5)
WBC: 3.7 10*3/uL — ABNORMAL LOW (ref 4.0–10.5)
nRBC: 0 % (ref 0.0–0.2)

## 2021-10-13 LAB — COMPREHENSIVE METABOLIC PANEL
ALT: 17 U/L (ref 0–44)
AST: 22 U/L (ref 15–41)
Albumin: 3.6 g/dL (ref 3.5–5.0)
Alkaline Phosphatase: 58 U/L (ref 38–126)
Anion gap: 7 (ref 5–15)
BUN: 9 mg/dL (ref 6–20)
CO2: 24 mmol/L (ref 22–32)
Calcium: 8.7 mg/dL — ABNORMAL LOW (ref 8.9–10.3)
Chloride: 111 mmol/L (ref 98–111)
Creatinine, Ser: 0.96 mg/dL (ref 0.44–1.00)
GFR, Estimated: >60 mL/min (ref >60)
Glucose, Bld: 88 mg/dL (ref 70–99)
Potassium: 4 mmol/L (ref 3.5–5.1)
Sodium: 142 mmol/L (ref 135–145)
Total Bilirubin: 1.2 mg/dL (ref 0.3–1.2)
Total Protein: 7 g/dL (ref 6.5–8.1)

## 2021-10-13 LAB — PROTIME-INR
INR: 1 (ref 0.8–1.2)
Prothrombin Time: 13.5 seconds (ref 11.4–15.2)

## 2021-10-13 LAB — POC URINE PREG, ED: Preg Test, Ur: NEGATIVE

## 2021-10-13 LAB — TROPONIN I (HIGH SENSITIVITY): Troponin I (High Sensitivity): 5 ng/L (ref <18)

## 2021-10-13 MED ORDER — ACETAMINOPHEN 325 MG PO TABS
650.0000 mg | ORAL_TABLET | Freq: Once | ORAL | Status: AC
  Administered 2021-10-13: 650 mg via ORAL
  Filled 2021-10-13: qty 2

## 2021-10-13 NOTE — ED Provider Notes (Signed)
[facility] Provider Note    Event Date/Time   First MD Initiated Contact with Patient 10/13/21 1018     (approximate)   History   Arm Pain   HPI  Karen Charles is a 51 y.o. 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  Patient is concerned that she has a blood clot as she has had blood clots in her opposite arm and both of her legs as well as a pulmonary embolism.  She reports that she went to urgent care but was told to come here as they do not have the ability to do ultrasound.  Patient denies chest pain or shortness of breath.  She has not noticed any swelling in her arm.  She describes her pain as an ache in her bicep area.  No paresthesias.  No neck pain.  She denies any chest pain or shortness of breath.  She reports that her pain is worse with any movement of her arm.  No radiation of pain.  No back pain.  Patient Active Problem List   Diagnosis Date Noted   Vitamin D deficiency 11/11/2019   Arm pain, left 11/11/2019   S/P bariatric surgery 11/07/2019   Elevated d-dimer 11/07/2019   History of DVT (deep vein thrombosis) 11/02/2019   History of pulmonary embolism 11/02/2019   DM2 (diabetes mellitus, type 2) (HCC) 05/29/2016   HTN (hypertension) 05/29/2016   Missed abortion 05/29/2016   Sickle cell trait (HCC) 05/29/2016   Unwanted fertility 05/29/2016   Bariatric surgery status 07/18/2014          Physical Exam   Triage Vital Signs: ED Triage Vitals [10/13/21 0938]  Enc Vitals Group     BP      Pulse      Resp      Temp      Temp src      SpO2      Weight 231 lb 7.7 oz (105 kg)     Height 5\' 8"  (1.727 m)     Head Circumference      Peak Flow      Pain Score 5     Pain Loc      Pain Edu?      Excl. in GC?     Most recent vital signs: Vitals:   10/13/21 1035 10/13/21  1037  BP: 131/82 113/71  Pulse: 98 (!) 55  Resp: 18 18  Temp: 98.3 F (36.8 C) 97.8 F (36.6 C)  SpO2: 97% 97%    Physical Exam Vitals and nursing note reviewed.  Constitutional:      General: Awake and alert. No acute distress.    Appearance: Normal appearance. The patient is overweight.  HENT:     Head: Normocephalic and atraumatic.     Mouth: Mucous membranes are moist.  Eyes:     General: PERRL. Normal EOMs        Right eye: No discharge.        Left eye: No discharge.     Conjunctiva/sclera: Conjunctivae normal.  Cardiovascular:     Rate and Rhythm: Normal rate and regular rhythm.     Pulses: Normal pulses.     Heart sounds: Normal heart sounds Pulmonary:     Effort: Pulmonary effort is normal. No respiratory distress.     Breath sounds: Normal  breath sounds.  Abdominal:     Abdomen is soft. There is no abdominal tenderness. No rebound or guarding. No distention. Musculoskeletal:        General: No swelling. Normal range of motion.     Cervical back: Normal range of motion and neck supple.  No midline cervical spine tenderness.  Full range of motion of neck.  Negative Spurling test.  Negative Lhermitte sign.  Normal strength and sensation in bilateral upper extremities. Normal grip strength bilaterally.  Normal intrinsic muscle function of the hand bilaterally.  Normal radial pulses bilaterally. Right upper extremity: no obvious deformity, swelling, ecchymosis, or erythema.  No swelling or pitting edema. No clavicular or AC joint tenderness Able to actively and passively forward flex and abduct at shoulder fully, negative drop arm test Negative Obriens, SLAP, empty can, and lift off tests though mild discomfort with SLAP and O'Brien's test Normal internal and external rotation against resistance Negative Hawkins and Neers Normal ROM at elbow and wrist Normal resisted pronation and supination 2+ radial pulse Normal grip strength Normal intrinsic hand muscle  function Skin:    General: Skin is warm and dry.     Capillary Refill: Capillary refill takes less than 2 seconds.     Findings: No rash.  Neurological:     Mental Status: The patient is awake and alert.      ED Results / Procedures / Treatments   Labs (all labs ordered are listed, but only abnormal results are displayed) Labs Reviewed  CBC WITH DIFFERENTIAL/PLATELET - Abnormal; Notable for the following components:      Result Value   WBC 3.7 (*)    Hemoglobin 11.0 (*)    HCT 34.6 (*)    MCV 77.6 (*)    MCH 24.7 (*)    Neutro Abs 1.3 (*)    All other components within normal limits  COMPREHENSIVE METABOLIC PANEL - Abnormal; Notable for the following components:   Calcium 8.7 (*)    All other components within normal limits  PROTIME-INR  POC URINE PREG, ED  TROPONIN I (HIGH SENSITIVITY)  TROPONIN I (HIGH SENSITIVITY)     EKG     RADIOLOGY I independently reviewed and interpreted imaging and agree with radiologists findings.     PROCEDURES:  Critical Care performed:   Procedures   MEDICATIONS ORDERED IN ED: Medications  acetaminophen (TYLENOL) tablet 650 mg (650 mg Oral Given 10/13/21 1137)     IMPRESSION / MDM / ASSESSMENT AND PLAN / ED COURSE  I reviewed the triage vital signs and the nursing notes.   Differential diagnosis includes, but is not limited to, DVT, muscle strain, muscle spasm, tendinitis, calcific tendinitis, referred pain, ACS.  Patient is awake and alert, hemodynamically stable and afebrile.  I reviewed her note from the urgent care prior to arrival who sent her here for ultrasound for evaluation of DVT.  This is the patient's concern today as well.  She has full normal range of motion of her arm, the pain is easily reproducible with movement.  She has worsened pain with empty can, O'Brien's test, and SLAP test, though she is able to perform these tests.  There is no pitting edema.  There is no neurovascular compromise.  Negative Spurling  and Lhermitte sign, do not suspect cervical etiology.  Labs including troponin were obtained which are overall reassuring.  Troponin is negative.  No indication for repeat troponin given that her symptoms have been ongoing >6 hours.  She does not currently have  any chest pain or shortness of breath.  Chest x-ray is normal.  Duplex ultrasound of her right upper extremity does not reveal any evidence of DVT.  Patient is reassured by these findings.  We discussed return precautions and the importance of close outpatient follow-up.  We also discussed symptomatic management.  Patient and her family understand and agree with plan.  She was discharged in stable condition.   Patient's presentation is most consistent with acute complicated illness / injury requiring diagnostic workup.   FINAL CLINICAL IMPRESSION(S) / ED DIAGNOSES   Final diagnoses:  Right arm pain     Rx / DC Orders   ED Discharge Orders     None        Note:  This document was prepared using Dragon voice recognition software and may include unintentional dictation errors.   Keturah Shavers 10/13/21 1211    Shaune Pollack, MD 10/13/21 1537

## 2021-10-13 NOTE — ED Triage Notes (Signed)
Pt sent over from UC to rule out blood clot. Facility reports pt with significant hx of clots and does not take blood thinners.  Pt reports pain to her right upper arm since last week worsening over the last 24 hours. Pt reports achy and states took some aspirin. Pt reports due to history of blood clots is concerned this may be a clot.

## 2021-10-13 NOTE — ED Triage Notes (Signed)
Pt c/o RT upper arm pain, pt states within this week she noticed the pain change, and even worse this morning at 5am. Pt did take an aspirin this morning. Pt reports concern for possible bloot clot, denies any recent injury to arm, states she has had several in the past. Pt states she does not take any blood thinners.

## 2021-10-13 NOTE — ED Notes (Signed)
Patient is being discharged from the Urgent Care and sent to the [facility] Emergency Department via private vehicle . Per Dr. Susa Simmonds, patient is in need of higher level of care due to R/O DVT. Patient is aware and verbalizes understanding of plan of care.  Vitals:   10/13/21 0858  BP: 120/84  Pulse: 64  Temp: 98.5 F (36.9 C)  SpO2: 96%

## 2021-10-13 NOTE — ED Provider Notes (Signed)
[facility]    CSN: [id number] Arrival date & time: 10/13/21  0843      History   Chief Complaint Chief Complaint  Patient presents with   Arm Pain    HPI  HPI Karen Charles is a 51 y.o. female.   [name] presents for right upper extremity throbbing pain. Pain woke her up around 5 AM. She has had multiple blood clots in the past. She took an 81 mg aspirin this morning. Her last blood clot was in her left arm and this pain feels similar. No recent travel or been on any long car rides. No recent surgeries. She sits for 10 hours a day.   No injuries or trauma. She is not on any blood thinners. Says her PCP stated she didn't need them.      Past Medical History:  Diagnosis Date   Asthma    Diabetes mellitus without complication (HCC)    GERD (gastroesophageal reflux disease)    Headache    Hx of blood clots    Presence of IVC filter    inserted 07/12/2014    Patient Active Problem List   Diagnosis Date Noted   Vitamin D deficiency 11/11/2019   Arm pain, left 11/11/2019   S/P bariatric surgery 11/07/2019   Elevated d-dimer 11/07/2019   History of DVT (deep vein thrombosis) 11/02/2019   History of pulmonary embolism 11/02/2019   DM2 (diabetes mellitus, type 2) ([place]) 05/29/2016   HTN (hypertension) 05/29/2016   Missed abortion 05/29/2016   Sickle cell trait ([place]) 05/29/2016   Unwanted fertility 05/29/2016   Bariatric surgery status 07/18/2014    Past Surgical History:  Procedure Laterality Date   CHOLECYSTECTOMY     DILATION AND CURETTAGE OF UTERUS     gallbladder removeral     LAPAROSCOPIC GASTRIC RESTRICTIVE DUODENAL PROCEDURE (DUODENAL SWITCH) N/A 07/18/2014   Procedure: LAPAROSCOPIC GASTRIC RESTRICTIVE DUODENAL PROCEDURE (DUODENAL SWITCH);  Surgeon: Ladora Daniel, MD;  Location: [facility] ORS;  Service: General;  Laterality: N/A;  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      Home Medications    Prior to Admission medications   Medication Sig Start Date End Date Taking? Authorizing Provider  albuterol (PROVENTIL HFA;VENTOLIN HFA) 108 (90 BASE) MCG/ACT inhaler Inhale 1-2 puffs into the lungs every 6 (six) hours as needed for wheezing or shortness of breath.    [provider]  benzonatate (TESSALON) 200 MG capsule Take 1 capsule (200 mg total) by mouth 3 (three) times daily as needed for cough. 01/30/20   Coral Spikes, DO  cetirizine (ZYRTEC) 10 MG tablet Take 10 mg by mouth daily as needed for allergies.    [provider]  Cholecalciferol (VITAMIN D3) 1.25 MG (50000 UT) TABS Take 50,000 Units by mouth once a week.    [provider]  cyanocobalamin 1000 MCG tablet Take by mouth.    [provider]  ergocalciferol (VITAMIN D2) 1.25 MG (50000 UT) capsule Take by mouth.    [provider]  ipratropium (ATROVENT) 0.06 % nasal spray Place 2 sprays into both nostrils 4 (four) times daily as needed for rhinitis. 01/30/20   Coral Spikes, DO  Multiple Vitamin (MULTIVITAMIN) tablet Take 1 tablet by mouth daily.    [provider]  naproxen (NAPROSYN) 500 MG tablet Take 1 tablet (500 mg total) by mouth  2 (two) times daily as needed for moderate pain or headache. 01/30/20   Tommie Sams, DO  pantoprazole (PROTONIX) 20 MG tablet Take 20 mg by mouth daily. 07/04/19   [provider]  enoxaparin (LOVENOX) 40 MG/0.4ML injection Inject 0.4 mLs (40 mg total) into the skin every 12 (twelve) hours. 07/20/14 04/09/15  Everette Rank, MD  RIVAROXABAN Carlena Hurl) VTE STARTER PACK (15 & 20 MG TABLETS) Take 15 mg by mouth 2 (two) times daily. Follow package directions: Take one 15mg  tablet by mouth twice a day. On day 22, switch to one 20mg  tablet once a day. Take with food. 10/30/19 01/30/20  12/30/19, NP    Family History Family History  Problem Relation Age of Onset   Lupus  Mother    Diabetes Father    Congestive Heart Failure Father    Hypertension Father    Breast cancer Neg Hx     Social History Social History   Tobacco Use   Smoking status: Never   Smokeless tobacco: Never  Vaping Use   Vaping Use: Never used  Substance Use Topics   Alcohol use: No   Drug use: No     Allergies   Patient has no known allergies.   Review of Systems Review of Systems: :negative unless otherwise stated in HPI.      Physical Exam Triage Vital Signs ED Triage Vitals  Enc Vitals Group     BP 10/13/21 0858 120/84     Pulse Rate 10/13/21 0858 64     Resp --      Temp 10/13/21 0858 98.5 F (36.9 C)     Temp Source 10/13/21 0858 Oral     SpO2 10/13/21 0858 96 %     Weight 10/13/21 0857 230 lb (104.3 kg)     Height 10/13/21 0857 5\' 8"  (1.727 m)     Head Circumference --      Peak Flow --      Pain Score 10/13/21 0857 5     Pain Loc --      Pain Edu? --      Excl. in GC? --    No data found.  Updated Vital Signs BP 120/84 (BP Location: Left Arm)   Pulse 64   Temp 98.5 F (36.9 C) (Oral)   Ht 5\' 8"  (1.727 m)   Wt 104.3 kg   LMP 10/04/2021 (Exact Date)   SpO2 96%   BMI 34.97 kg/m   Visual Acuity Right Eye Distance:   Left Eye Distance:   Bilateral Distance:    Right Eye Near:   Left Eye Near:    Bilateral Near:     Physical Exam GEN: well appearing female in no acute distress  CVS: well perfused, regular rate and rhythm RESP: speaking in full sentences without pause, no respiratory distress, clear to auscultation bilaterally MSK: Normal range of motion of right shoulder and elbow, normal strength, no bony tenderness, no evidence of bony deformity, asymmetry, or muscle atrophy. Sensation intact. Peripheral pulses intact. No LE edema SKIN: Warm, dry   UC Treatments / Results  Labs (all labs ordered are listed, but only abnormal results are displayed) Labs Reviewed - No data to display  EKG   Radiology No results  found.  Procedures Procedures (including critical care time)  Medications Ordered in UC Medications - No data to display  Initial Impression / Assessment and Plan / UC Course  I have reviewed the triage vital signs and the nursing  notes.  Pertinent labs & imaging results that were available during my care of the patient were reviewed by me and considered in my medical decision making (see chart for details).      Pt is a 51 y.o.  female with history of PE and DVT with acute onset right upper extremity pain this morning.  Overall patient is well-appearing, well-hydrated and in no respiratory distress.  Vital signs are stable.  She is satting well on room air.   Unfortunately, we do not have D-dimer and ultrasound available today. Given her history of similar symptoms in the past with blood clots and not being on blood thinners advised patient that she needs to be evaluated in the emergency department.  Patient to go straight to the emergency department.  She would like to go to [facility].  Spoke to the triage nurse at [facility] who will await patient's arrival.  Discussed MDM, treatment plan and plan for follow-up with patient/parent who agrees with plan.   Final Clinical Impressions(s) / UC Diagnoses   Final diagnoses:  Right arm pain  History of pulmonary embolus (PE)  History of DVT (deep vein thrombosis)     Discharge Instructions      You have been advised to follow up immediately in the emergency department for concerning signs or symptoms as discussed during your visit. If you declined EMS transport, please have a family member take you directly to the ED at this time. Do not delay.   Based on concerns about condition, if you do not follow up in the ED, you may risk poor outcomes including worsening of condition, delayed treatment and potentially life threatening issues. If you have declined to go to the ED at this time, you should call your PCP immediately to set up a follow up  appointment.       ED Prescriptions   None    PDMP not reviewed this encounter.   Katha Cabal, DO 10/13/21 [phone]

## 2021-10-13 NOTE — Discharge Instructions (Addendum)
Your blood work, ultrasound, and x-ray were reassuring.  There is no evidence of blood clot.  You may continue to take Tylenol/ibuprofen per package instructions to help with your symptoms.  Please return for any new, worsening, or change in symptoms or other concerns.  It was a pleasure caring for you today.

## 2021-10-13 NOTE — Discharge Instructions (Addendum)
You have been advised to follow up immediately in the emergency department for concerning signs or symptoms as discussed during your visit. If you declined EMS transport, please have a family member take you directly to the ED at this time. Do not delay.   Based on concerns about condition, if you do not follow up in the ED, you may risk poor outcomes including worsening of condition, delayed treatment and potentially life threatening issues. If you have declined to go to the ED at this time, you should call your PCP immediately to set up a follow up appointment.   

## 2021-11-18 ENCOUNTER — Encounter (INDEPENDENT_AMBULATORY_CARE_PROVIDER_SITE_OTHER): Payer: Self-pay

## 2022-10-09 ENCOUNTER — Other Ambulatory Visit: Payer: Self-pay | Admitting: Family Medicine

## 2022-10-09 DIAGNOSIS — Z1231 Encounter for screening mammogram for malignant neoplasm of breast: Secondary | ICD-10-CM

## 2022-10-23 ENCOUNTER — Ambulatory Visit
Admission: RE | Admit: 2022-10-23 | Discharge: 2022-10-23 | Disposition: A | Discharge status: Home / Self Care | Payer: BC Managed Care – PPO | Source: Ambulatory Visit | Attending: Family Medicine | Admitting: Family Medicine

## 2022-10-23 DIAGNOSIS — Z1231 Encounter for screening mammogram for malignant neoplasm of breast: Secondary | ICD-10-CM | POA: Insufficient documentation

## 2023-07-04 ENCOUNTER — Ambulatory Visit
Admission: EM | Admit: 2023-07-04 | Discharge: 2023-07-04 | Disposition: A | Discharge status: Home / Self Care | Attending: Nurse Practitioner | Admitting: Nurse Practitioner

## 2023-07-04 ENCOUNTER — Ambulatory Visit (INDEPENDENT_AMBULATORY_CARE_PROVIDER_SITE_OTHER)

## 2023-07-04 ENCOUNTER — Encounter: Payer: Self-pay | Admitting: Emergency Medicine

## 2023-07-04 DIAGNOSIS — R051 Acute cough: Secondary | ICD-10-CM

## 2023-07-04 DIAGNOSIS — J069 Acute upper respiratory infection, unspecified: Secondary | ICD-10-CM | POA: Diagnosis present

## 2023-07-04 LAB — RESP PANEL BY RT-PCR (FLU A&B, COVID) ARPGX2
Influenza A by PCR: NEGATIVE
Influenza B by PCR: NEGATIVE
SARS Coronavirus 2 by RT PCR: NEGATIVE

## 2023-07-04 MED ORDER — IPRATROPIUM BROMIDE 0.06 % NA SOLN: 2.0000 | Freq: Four times a day (QID) | NASAL | 12 refills | Status: DC

## 2023-07-04 MED ORDER — IPRATROPIUM BROMIDE 0.06 % NA SOLN: 2.0000 | Freq: Four times a day (QID) | NASAL | 12 refills | Status: AC

## 2023-07-04 MED ORDER — ACETAMINOPHEN 325 MG PO TABS
975.0000 mg | ORAL_TABLET | Freq: Once | ORAL | Status: AC
  Administered 2023-07-04: 975 mg via ORAL

## 2023-07-04 MED ORDER — BENZONATATE 100 MG PO CAPS: 200.0000 mg | ORAL_CAPSULE | Freq: Three times a day (TID) | ORAL | 0 refills | Status: AC

## 2023-07-04 MED ORDER — PROMETHAZINE-DM 6.25-15 MG/5ML PO SYRP: 5.0000 mL | ORAL_SOLUTION | Freq: Four times a day (QID) | ORAL | 0 refills | Status: DC | PRN

## 2023-07-04 MED ORDER — BENZONATATE 100 MG PO CAPS: 200.0000 mg | ORAL_CAPSULE | Freq: Three times a day (TID) | ORAL | 0 refills | Status: DC

## 2023-07-04 MED ORDER — PROMETHAZINE-DM 6.25-15 MG/5ML PO SYRP: 5.0000 mL | ORAL_SOLUTION | Freq: Four times a day (QID) | ORAL | 0 refills | Status: AC | PRN

## 2023-07-04 NOTE — ED Triage Notes (Signed)
 Patient c/o cough, fever, bodyaches, headaches that started yesterday.

## 2023-07-04 NOTE — ED Provider Notes (Signed)
 [facility]    CSN: [id number] Arrival date & time: 07/04/23  1310      History   Chief Complaint Chief Complaint  Patient presents with   Fever   Cough    HPI Karen Charles is a 53 y.o. female.   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  She also Dors is runny nose and nasal congestion.  She reports her cough is productive.  She denies any ear pain or sore throat.  No shortness breath or wheezing.  No known sick contacts or recent travel out of the state or country.  Past Medical History:  Diagnosis Date   Asthma    Diabetes mellitus without complication (HCC)    GERD (gastroesophageal reflux disease)    Headache    Hx of blood clots    Presence of IVC filter    inserted 07/12/2014    Patient Active Problem List   Diagnosis Date Noted   Vitamin D deficiency 11/11/2019   Arm pain, left 11/11/2019   S/P bariatric surgery 11/07/2019   Elevated d-dimer 11/07/2019   History of DVT (deep vein thrombosis) 11/02/2019   History of pulmonary embolism 11/02/2019   DM2 (diabetes mellitus, type 2) (HCC) 05/29/2016   HTN (hypertension) 05/29/2016   Missed abortion 05/29/2016   Sickle cell trait (HCC) 05/29/2016   Unwanted fertility 05/29/2016   Bariatric surgery status 07/18/2014    Past Surgical History:  Procedure Laterality Date   CHOLECYSTECTOMY     DILATION AND CURETTAGE OF UTERUS     gallbladder removeral     LAPAROSCOPIC GASTRIC RESTRICTIVE DUODENAL PROCEDURE (DUODENAL SWITCH) N/A 07/18/2014   Procedure: LAPAROSCOPIC GASTRIC RESTRICTIVE DUODENAL PROCEDURE (DUODENAL SWITCH);  Surgeon: Robby Chime, MD;  Location: [facility] ORS;  Service: General;  Laterality: N/A;  Duodenal switch with repair biliary  pancreatic diversion and repair of hiatal hernia   TONSILLECTOMY      OB History   No obstetric history on file.      Home Medications    Prior to Admission medications   Medication Sig Start Date End Date Taking? Authorizing Provider  benzonatate  (TESSALON ) 100 MG capsule Take 2 capsules (200 mg total) by mouth every 8 (eight) hours. 07/04/23  Yes Kent Pear, NP  ipratropium (ATROVENT ) 0.06 % nasal spray Place 2 sprays into both nostrils 4 (four) times daily. 07/04/23  Yes Kent Pear, NP  promethazine -dextromethorphan (PROMETHAZINE -DM) 6.25-15 MG/5ML syrup Take 5 mLs by mouth 4 (four) times daily as needed. 07/04/23  Yes Kent Pear, NP  cetirizine (ZYRTEC) 10 MG tablet Take 10 mg by mouth daily as needed for allergies.    [provider]  Cholecalciferol (VITAMIN D3) 1.25 MG (50000 UT) TABS Take 50,000 Units by mouth once a week.    [provider]  cyanocobalamin 1000 MCG tablet Take by mouth.    [provider]  ergocalciferol (VITAMIN D2) 1.25 MG (50000 UT) capsule Take by mouth.    [provider]  Multiple Vitamin (MULTIVITAMIN) tablet Take 1 tablet by mouth daily.    [provider]  pantoprazole  (PROTONIX ) 20 MG tablet Take 20 mg by mouth daily. 07/04/19   [provider]  enoxaparin  (LOVENOX ) 40 MG/0.4ML injection Inject 0.4 mLs (40 mg total) into the  skin every 12 (twelve) hours. 07/20/14 04/09/15  Robby Chime, MD  RIVAROXABAN  (XARELTO ) VTE STARTER PACK (15 & 20 MG TABLETS) Take 15 mg by mouth 2 (two) times daily. Follow package directions: Take one 15mg  tablet by mouth twice a day. On day 22, switch to one 20mg  tablet once a day. Take with food. 10/30/19 01/30/20  Kent Pear, NP    Family History Family History  Problem Relation Age of Onset   Lupus Mother    Diabetes Father    Congestive Heart Failure Father    Hypertension Father    Breast cancer Neg Hx     Social History Social History   Tobacco Use    Smoking status: Never   Smokeless tobacco: Never  Vaping Use   Vaping status: Never Used  Substance Use Topics   Alcohol use: No   Drug use: No     Allergies   Patient has no known allergies.   Review of Systems Review of Systems  Constitutional:  Positive for fever.  HENT:  Positive for congestion and rhinorrhea. Negative for ear pain and sore throat.   Respiratory:  Positive for cough. Negative for shortness of breath and wheezing.   Musculoskeletal:  Positive for arthralgias and myalgias.  Neurological:  Positive for headaches.     Physical Exam Triage Vital Signs ED Triage Vitals  Encounter Vitals Group     BP      Girls Systolic BP Percentile      Girls Diastolic BP Percentile      Boys Systolic BP Percentile      Boys Diastolic BP Percentile      Pulse      Resp      Temp      Temp src      SpO2      Weight      Height      Head Circumference      Peak Flow      Pain Score      Pain Loc      Pain Education      Exclude from Growth Chart    No data found.  Updated Vital Signs BP 126/88 (BP Location: Left Arm)   Pulse (!) 114   Temp (!) 102.6 F (39.2 C) (Oral)   Resp 15   Ht 5' 8 (1.727 m)   Wt 231 lb 7.7 oz (105 kg)   LMP 07/02/2023 (Exact Date)   SpO2 98%   BMI 35.20 kg/m   Visual Acuity Right Eye Distance:   Left Eye Distance:   Bilateral Distance:    Right Eye Near:   Left Eye Near:    Bilateral Near:     Physical Exam Vitals and nursing note reviewed.  Constitutional:      Appearance: Normal appearance. She is not ill-appearing.  HENT:     Head: Normocephalic and atraumatic.     Right Ear: Tympanic membrane, ear canal and external ear normal. There is no impacted cerumen.     Left Ear: Tympanic membrane, ear canal and external ear normal. There is no impacted cerumen.     Nose: Congestion and rhinorrhea present.     Comments: His mucosa is erythematous and edematous with clear discharge in both nares.    Mouth/Throat:      Mouth: Mucous membranes are moist.     Pharynx: Oropharynx is clear. Posterior oropharyngeal erythema present. No oropharyngeal exudate.     Comments: Erythema to the posterior oropharynx  with clear postnasal drip.  Cardiovascular:     Rate and Rhythm: Normal rate and regular rhythm.     Pulses: Normal pulses.     Heart sounds: Normal heart sounds. No murmur heard.    No friction rub. No gallop.  Pulmonary:     Effort: Pulmonary effort is normal.     Breath sounds: Normal breath sounds. No wheezing, rhonchi or rales.   Musculoskeletal:     Cervical back: Normal range of motion and neck supple. No tenderness.  Lymphadenopathy:     Cervical: No cervical adenopathy.   Skin:    General: Skin is warm and dry.     Capillary Refill: Capillary refill takes less than 2 seconds.     Findings: No rash.   Neurological:     General: No focal deficit present.     Mental Status: She is alert and oriented to person, place, and time.      UC Treatments / Results  Labs (all labs ordered are listed, but only abnormal results are displayed) Labs Reviewed  RESP PANEL BY RT-PCR (FLU A&B, COVID) [id number]    EKG   Radiology DG Chest 2 View Result Date: 07/04/2023 CLINICAL DATA:  Productive cough and fever. EXAM: CHEST - 2 VIEW COMPARISON:  10/13/2021 FINDINGS: The heart size and mediastinal contours are within normal limits. Both lungs are clear. The visualized skeletal structures are unremarkable. IMPRESSION: No active cardiopulmonary disease. Electronically Signed   By: Marlyce Sine M.D.   On: 07/04/2023 14:18    Procedures Procedures (including critical care time)  Medications Ordered in UC Medications  acetaminophen  (TYLENOL ) tablet 975 mg (975 mg Oral Given 07/04/23 1343)    Initial Impression / Assessment and Plan / UC Course  I have reviewed the triage vital signs and the nursing notes.  Pertinent labs & imaging results that were available during my care of the patient were  reviewed by me and considered in my medical decision making (see chart for details).   Patient is a pleasant, nontoxic-appearing 53 year old female presenting for evaluation of respiratory symptoms that began last night as outlined HPI above.  In the exam room she is able to speak in full sentences without dyspnea or tachypnea.  Her physical exam does reveal inflammation of her upper respiratory tract as evidenced by inflamed nasal mucosa with clear nasal discharge.  She also has erythema to the posterior oropharynx with clear postnasal drip.  Her cardiopulmonary exam is benign.  She is currently febrile with an oral temp of 102.6.  I will order 975 of Tylenol  for her fever.  Differential diagnose include COVID, influenza, pneumonia, viral respiratory illness.  I will order a COVID and flu PCR as well as a chest x-ray to evaluate for any acute cardiopulmonary pathology.  Chest x-ray independently reviewed and evaluated by me.  Impression: Lung fields are well aerated without evidence of infiltrate or effusion.  Cardiomediastinal silhouette appears normal.  Radiology overread is pending. Radiology impression states no active cardiopulmonary disease.  Respiratory panel is negative for COVID or influenza.  I will discharge patient home with a diagnosis of viral URI with a cough.  She can continue Tylenol  and/or ibuprofen as needed for fever.  I will prescribe Atrovent  nasal spray that she can use for nasal congestion along with Tessalon  Perles and Promethazine  DM cough syrup for cough and congestion.   Final Clinical Impressions(s) / UC Diagnoses   Final diagnoses:  Acute cough  Viral URI with cough  Discharge Instructions      Your nasal swab was negative for COVID and influenza and your chest x-ray did not show any evidence of pneumonia.  I do believe you have a viral respiratory infection which is causing your symptoms.  Please continue to use over-the-counter Tylenol  according to the  package instructions as needed for fever.  Use the Atrovent  nasal spray, 2 squirts in each nostril every 6 hours, as needed for runny nose and postnasal drip.  Use the Tessalon  Perles every 8 hours during the day.  Take them with a small sip of water.  They may give you some numbness to the base of your tongue or a metallic taste in your mouth, this is normal.  Use the Promethazine  DM cough syrup at bedtime for cough and congestion.  It will make you drowsy so do not take it during the day.  Return for reevaluation or see your primary care provider for any new or worsening symptoms.      ED Prescriptions     Medication Sig Dispense Auth. Provider   benzonatate  (TESSALON ) 100 MG capsule Take 2 capsules (200 mg total) by mouth every 8 (eight) hours. 21 capsule Kent Pear, NP   ipratropium (ATROVENT ) 0.06 % nasal spray Place 2 sprays into both nostrils 4 (four) times daily. 15 mL Kent Pear, NP   promethazine -dextromethorphan (PROMETHAZINE -DM) 6.25-15 MG/5ML syrup Take 5 mLs by mouth 4 (four) times daily as needed. 118 mL Kent Pear, NP      PDMP not reviewed this encounter.   Kent Pear, NP 07/04/23 1424

## 2023-07-04 NOTE — Discharge Instructions (Addendum)
 Your nasal swab was negative for COVID and influenza and your chest x-ray did not show any evidence of pneumonia.  I do believe you have a viral respiratory infection which is causing your symptoms.  Please continue to use over-the-counter Tylenol  according to the package instructions as needed for fever.  Use the Atrovent  nasal spray, 2 squirts in each nostril every 6 hours, as needed for runny nose and postnasal drip.  Use the Tessalon  Perles every 8 hours during the day.  Take them with a small sip of water.  They may give you some numbness to the base of your tongue or a metallic taste in your mouth, this is normal.  Use the Promethazine  DM cough syrup at bedtime for cough and congestion.  It will make you drowsy so do not take it during the day.  Return for reevaluation or see your primary care provider for any new or worsening symptoms.

## 2023-09-09 ENCOUNTER — Other Ambulatory Visit: Payer: Self-pay | Admitting: Family Medicine

## 2023-09-09 DIAGNOSIS — Z1231 Encounter for screening mammogram for malignant neoplasm of breast: Secondary | ICD-10-CM

## 2023-10-29 ENCOUNTER — Ambulatory Visit
Admission: RE | Admit: 2023-10-29 | Discharge: 2023-10-29 | Disposition: A | Discharge status: Home / Self Care | Source: Ambulatory Visit | Attending: Family Medicine | Admitting: Family Medicine

## 2023-10-29 DIAGNOSIS — Z1231 Encounter for screening mammogram for malignant neoplasm of breast: Secondary | ICD-10-CM | POA: Insufficient documentation

## 2024-01-31 ENCOUNTER — Encounter: Payer: Self-pay | Admitting: Emergency Medicine

## 2024-01-31 ENCOUNTER — Emergency Department: Payer: Self-pay

## 2024-01-31 ENCOUNTER — Other Ambulatory Visit: Payer: Self-pay

## 2024-01-31 ENCOUNTER — Emergency Department
Admission: EM | Admit: 2024-01-31 | Discharge: 2024-01-31 | Disposition: A | Discharge status: Home / Self Care | Payer: Self-pay | Attending: Emergency Medicine | Admitting: Emergency Medicine

## 2024-01-31 DIAGNOSIS — R1010 Upper abdominal pain, unspecified: Secondary | ICD-10-CM | POA: Insufficient documentation

## 2024-01-31 DIAGNOSIS — R6510 Systemic inflammatory response syndrome (SIRS) of non-infectious origin without acute organ dysfunction: Secondary | ICD-10-CM | POA: Insufficient documentation

## 2024-01-31 DIAGNOSIS — J45909 Unspecified asthma, uncomplicated: Secondary | ICD-10-CM | POA: Insufficient documentation

## 2024-01-31 DIAGNOSIS — E119 Type 2 diabetes mellitus without complications: Secondary | ICD-10-CM | POA: Insufficient documentation

## 2024-01-31 DIAGNOSIS — Z7901 Long term (current) use of anticoagulants: Secondary | ICD-10-CM | POA: Insufficient documentation

## 2024-01-31 DIAGNOSIS — R000 Tachycardia, unspecified: Secondary | ICD-10-CM | POA: Insufficient documentation

## 2024-01-31 DIAGNOSIS — Z86711 Personal history of pulmonary embolism: Secondary | ICD-10-CM | POA: Insufficient documentation

## 2024-01-31 DIAGNOSIS — R197 Diarrhea, unspecified: Secondary | ICD-10-CM | POA: Insufficient documentation

## 2024-01-31 DIAGNOSIS — R509 Fever, unspecified: Secondary | ICD-10-CM | POA: Insufficient documentation

## 2024-01-31 LAB — CBC WITH DIFFERENTIAL/PLATELET
Abs Immature Granulocytes: 0.02 K/uL (ref 0.00–0.07)
Basophils Absolute: 0 K/uL (ref 0.0–0.1)
Basophils Relative: 0 %
Eosinophils Absolute: 0 K/uL (ref 0.0–0.5)
Eosinophils Relative: 1 %
HCT: 38.6 % (ref 36.0–46.0)
Hemoglobin: 12.9 g/dL (ref 12.0–15.0)
Immature Granulocytes: 0 %
Lymphocytes Relative: 7 %
Lymphs Abs: 0.5 K/uL — ABNORMAL LOW (ref 0.7–4.0)
MCH: 27.6 pg (ref 26.0–34.0)
MCHC: 33.4 g/dL (ref 30.0–36.0)
MCV: 82.5 fL (ref 80.0–100.0)
Monocytes Absolute: 0.2 K/uL (ref 0.1–1.0)
Monocytes Relative: 3 %
Neutro Abs: 6.1 K/uL (ref 1.7–7.7)
Neutrophils Relative %: 89 %
Platelets: 270 K/uL (ref 150–400)
RBC: 4.68 MIL/uL (ref 3.87–5.11)
RDW: 13.3 % (ref 11.5–15.5)
WBC: 6.8 K/uL (ref 4.0–10.5)
nRBC: 0 % (ref 0.0–0.2)

## 2024-01-31 LAB — COMPREHENSIVE METABOLIC PANEL WITH GFR
ALT: 47 U/L — ABNORMAL HIGH (ref 0–44)
AST: 71 U/L — ABNORMAL HIGH (ref 15–41)
Albumin: 4.1 g/dL (ref 3.5–5.0)
Alkaline Phosphatase: 83 U/L (ref 38–126)
Anion gap: 12 (ref 5–15)
BUN: 10 mg/dL (ref 6–20)
CO2: 21 mmol/L — ABNORMAL LOW (ref 22–32)
Calcium: 9.5 mg/dL (ref 8.9–10.3)
Chloride: 106 mmol/L (ref 98–111)
Creatinine, Ser: 0.95 mg/dL (ref 0.44–1.00)
GFR, Estimated: >60 mL/min
Glucose, Bld: 116 mg/dL — ABNORMAL HIGH (ref 70–99)
Potassium: 3.8 mmol/L (ref 3.5–5.1)
Sodium: 139 mmol/L (ref 135–145)
Total Bilirubin: 1.1 mg/dL (ref 0.0–1.2)
Total Protein: 7.6 g/dL (ref 6.5–8.1)

## 2024-01-31 LAB — URINALYSIS, W/ REFLEX TO CULTURE (INFECTION SUSPECTED)
Bacteria, UA: NONE SEEN
Bilirubin Urine: NEGATIVE
Glucose, UA: NEGATIVE mg/dL
Ketones, ur: NEGATIVE mg/dL
Leukocytes,Ua: NEGATIVE
Nitrite: NEGATIVE
Protein, ur: NEGATIVE mg/dL
Specific Gravity, Urine: 1.011 (ref 1.005–1.030)
pH: 5 (ref 5.0–8.0)

## 2024-01-31 LAB — RESP PANEL BY RT-PCR (RSV, FLU A&B, COVID)  RVPGX2
Influenza A by PCR: NEGATIVE
Influenza B by PCR: NEGATIVE
Resp Syncytial Virus by PCR: NEGATIVE
SARS Coronavirus 2 by RT PCR: NEGATIVE

## 2024-01-31 LAB — LIPASE, BLOOD: Lipase: 20 U/L (ref 11–51)

## 2024-01-31 LAB — PROTIME-INR
INR: 1.1 (ref 0.8–1.2)
Prothrombin Time: 14.4 s (ref 11.4–15.2)

## 2024-01-31 LAB — LACTIC ACID, PLASMA
Lactic Acid, Venous: 2.1 mmol/L (ref 0.5–1.9)
Lactic Acid, Venous: 1.3 mmol/L (ref 0.5–1.9)

## 2024-01-31 MED ORDER — APIXABAN 5 MG PO TABS
5.0000 mg | ORAL_TABLET | Freq: Two times a day (BID) | ORAL | Status: DC
  Administered 2024-01-31: 5 mg via ORAL
  Filled 2024-01-31: qty 1

## 2024-01-31 MED ORDER — ACETAMINOPHEN 325 MG PO TABS
650.0000 mg | ORAL_TABLET | Freq: Once | ORAL | Status: AC
  Administered 2024-01-31: 650 mg via ORAL
  Filled 2024-01-31: qty 2

## 2024-01-31 MED ORDER — LACTATED RINGERS IV BOLUS (SEPSIS)
1000.0000 mL | Freq: Once | INTRAVENOUS | Status: AC
  Administered 2024-01-31: 1000 mL via INTRAVENOUS

## 2024-01-31 MED ORDER — IOHEXOL 300 MG/ML  SOLN
100.0000 mL | Freq: Once | INTRAMUSCULAR | Status: AC | PRN
  Administered 2024-01-31: 100 mL via INTRAVENOUS

## 2024-01-31 NOTE — ED Provider Notes (Signed)
 "  [facility] Provider Note    Event Date/Time   First MD Initiated Contact with Patient 01/31/24 [phone]     (approximate)   History   Abdominal Pain and Fever   HPI  Karen Charles is a 54 y.o. female   Past medical history of cholecystectomy, diabetes, asthma, GERD, PE with IVC filter and is on Eliquis , here with fever and upper abdominal/bilateral flank pain starting last night.  She went to bed in her regular state of health.  The pain hit her and then went away.  She is no longer in pain.  She denies nausea or vomiting.  She has had some loose stools recently but no GI bleeding.  She denies dysuria or frequency.  She had no respiratory infectious symptoms.  She has been fully compliant with all medications including her Eliquis .  Independent Historian contributed to assessment above: Both her children are at bedside to corroborate information above  External Medical Documents Reviewed: Outpatient notes from hematology/oncology from September 2025      Physical Exam   Triage Vital Signs: ED Triage Vitals [01/31/24 0401]  Encounter Vitals Group     BP 119/81     Girls Systolic BP Percentile      Girls Diastolic BP Percentile      Boys Systolic BP Percentile      Boys Diastolic BP Percentile      Pulse Rate (!) 113     Resp (!) 22     Temp (!) 100.5 F (38.1 C)     Temp Source Oral     SpO2 96 %     Weight 250 lb (113.4 kg)     Height      Head Circumference      Peak Flow      Pain Score 0     Pain Loc      Pain Education      Exclude from Growth Chart     Most recent vital signs: Vitals:   01/31/24 0557 01/31/24 0652  BP: 119/71   Pulse: (!) 103   Resp: 16   Temp:  99.1 F (37.3 C)  SpO2: 95%     General: Awake, no distress.  CV:  Good peripheral perfusion.  Resp:  Normal effort.  Abd:  No distention.  Other:  Low-grade fever and mild tachycardia otherwise vital signs normal.  Clear lungs to auscultation soft benign  abdominal exam deep palpation all quadrants.  Neck supple full range of motion.  Pleasant woman.   ED Results / Procedures / Treatments   Labs (all labs ordered are listed, but only abnormal results are displayed) Labs Reviewed  LACTIC ACID, PLASMA - Abnormal; Notable for the following components:      Result Value   Lactic Acid, Venous 2.1 (*)    All other components within normal limits  URINALYSIS, W/ REFLEX TO CULTURE (INFECTION SUSPECTED) - Abnormal; Notable for the following components:   Color, Urine YELLOW (*)    APPearance CLEAR (*)    Hgb urine dipstick SMALL (*)    All other components within normal limits  CBC WITH DIFFERENTIAL/PLATELET - Abnormal; Notable for the following components:   Lymphs Abs 0.5 (*)    All other components within normal limits  COMPREHENSIVE METABOLIC PANEL WITH GFR - Abnormal; Notable for the following components:   CO2 21 (*)    Glucose, Bld 116 (*)    AST 71 (*)    ALT 47 (*)  All other components within normal limits  RESP PANEL BY RT-PCR (RSV, FLU A&B, COVID)  [id number]  CULTURE, BLOOD (ROUTINE X 2)  CULTURE, BLOOD (ROUTINE X 2)  LIPASE, BLOOD  PROTIME-INR  LACTIC ACID, PLASMA     I ordered and reviewed the above labs they are notable for cell counts electrolytes largely unremarkable and lactic acidosis of 2.1.  EKG  ED ECG REPORT I, Ginnie Shams, the attending physician, personally viewed and interpreted this ECG.   Date: 01/31/2024  EKG Time: 0407  Rate: 112  Rhythm: sinus tachycardia  Axis: nl  Intervals:nl  ST&T Change: no stemi    RADIOLOGY I independently reviewed and interpreted chest x-ray and I see no obvious focality or pneumothorax I also reviewed radiologist's formal read.   PROCEDURES:  Critical Care performed: Yes, see critical care procedure note(s)  .Critical Care  Performed by: Shams Ginnie, MD Authorized by: Shams Ginnie, MD   Critical care provider statement:    Critical care time (minutes):  30    Critical care was time spent personally by me on the following activities:  Development of treatment plan with patient or surrogate, discussions with consultants, evaluation of patient's response to treatment, examination of patient, ordering and review of laboratory studies, ordering and review of radiographic studies, ordering and performing treatments and interventions, pulse oximetry, re-evaluation of patient's condition and review of old charts    MEDICATIONS ORDERED IN ED: Medications  apixaban  (ELIQUIS ) tablet 5 mg (has no administration in time range)  lactated ringers  bolus 1,000 mL (0 mLs Intravenous Stopped 01/31/24 0535)  acetaminophen  (TYLENOL ) tablet 650 mg (650 mg Oral Given 01/31/24 0503)  iohexol  (OMNIPAQUE ) 300 MG/ML solution 100 mL (100 mLs Intravenous Contrast Given 01/31/24 0538)    IMPRESSION / MDM / ASSESSMENT AND PLAN / ED COURSE  I reviewed the triage vital signs and the nursing notes.                                Patient's presentation is most consistent with acute presentation with potential threat to life or bodily function.  Differential diagnosis includes, but is not limited to, respiratory infection, intra-abdominal infection, choledocholithiasis, pancreatitis, urinary tract infection, pyelonephritis, sepsis   The patient is on the cardiac monitor to evaluate for evidence of arrhythmia and/or significant heart rate changes.  MDM:    54 year old with fever, tachycardia, abdominal pain that was transient and resolved concerning for sepsis given her vital sign abnormalities.  No obvious source.  Question intra-abdominal source choledocholithiasis given her history of cholecystectomy and upper abdominal/flank pain and got a CT scan and urinalysis both of which were unremarkable for acute findings today.  Consider respiratory infection though she did manifest no respiratory infectious symptoms and she has a clear chest x-ray and normal lung exam, doubtful.  Her  abdominal pain is completely resolved, vital signs are improving, she had a mild lactic acidosis of 2.1 and after fluids we will recheck.  May be the beginning of a viral illness.  However given her resolution of symptoms, feels very well and eager to go home and no discernible abnormalities on lab testing today assuming that her repeat lactic acid is normalized, I will discharge her home with PMD follow-up and close return precautions as she desires.       FINAL CLINICAL IMPRESSION(S) / ED DIAGNOSES   Final diagnoses:  Fever, unspecified fever cause  Pain of upper abdomen  SIRS (systemic  inflammatory response syndrome) (HCC)     Rx / DC Orders   ED Discharge Orders     None        Note:  This document was prepared using Dragon voice recognition software and may include unintentional dictation errors.    Cyrena Mylar, MD 01/31/24 [phone]  "

## 2024-01-31 NOTE — Discharge Instructions (Signed)
 Fortunately your evaluation in the emergency department did not show any emergency conditions or obvious bacterial infections that would require hospitalization, antibiotics or surgery at this time.  I am not sure what was causing your abdominal pain and low-grade fever.  It may be the very beginning of a viral infection but you did negative for influenza, RSV, and COVID specifically at this time.  Since you are feeling better and your testing did not show any marked abnormalities, I think it is okay to go home and continue to monitor your symptoms closely and follow-up with your doctor.  For fever you may take 650 mg of Tylenol  every 6 hours as needed.  Thank you for choosing us  for your health care today!  Please see your primary doctor this week for a follow up appointment.   If you have any new, worsening, or unexpected symptoms call your doctor right away or come back to the emergency department for reevaluation.  It was my pleasure to care for you today.   Ginnie EDISON Cyrena, MD

## 2024-01-31 NOTE — ED Notes (Signed)
 Notified EDP Cyrena that pt requested morning dose of Eliquis  5mg .

## 2024-01-31 NOTE — ED Triage Notes (Signed)
 Pt in with sudden onset of sharp epigastric pain that woke her up at 0100, reports chills through the night as well. Pt states the pain radiates from the center to R and L upper quadrants. Pt does report 3 days of diarrhea, denies any n/v.  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  EMS VS: 124/78 115HR 22

## 2024-02-05 LAB — CULTURE, BLOOD (ROUTINE X 2)
Culture: NO GROWTH
Culture: NO GROWTH
